# Patient Record
Sex: Male | Born: 1950 | Race: White | Hispanic: No | Marital: Married | Smoking: Never smoker
Health system: Southern US, Community
[De-identification: ages and names within clinical notes are randomized; demographics above are authoritative.]

## PROBLEM LIST (undated history)

## (undated) DIAGNOSIS — J302 Other seasonal allergic rhinitis: Secondary | ICD-10-CM

## (undated) DIAGNOSIS — M1990 Unspecified osteoarthritis, unspecified site: Secondary | ICD-10-CM

## (undated) DIAGNOSIS — H269 Unspecified cataract: Secondary | ICD-10-CM

## (undated) DIAGNOSIS — B029 Zoster without complications: Secondary | ICD-10-CM

## (undated) DIAGNOSIS — T1490XA Injury, unspecified, initial encounter: Secondary | ICD-10-CM

## (undated) HISTORY — DX: Unspecified osteoarthritis, unspecified site: M19.90

## (undated) HISTORY — DX: Other seasonal allergic rhinitis: J30.2

## (undated) HISTORY — PX: ROTATOR CUFF REPAIR: SHX139

## (undated) HISTORY — PX: VASECTOMY: SHX75

## (undated) HISTORY — DX: Unspecified cataract: H26.9

## (undated) HISTORY — DX: Injury, unspecified, initial encounter: T14.90XA

## (undated) HISTORY — DX: Zoster without complications: B02.9

## (undated) HISTORY — PX: EYE SURGERY: SHX253

---

## 2007-03-24 ENCOUNTER — Ambulatory Visit: Payer: Self-pay | Admitting: Family Medicine

## 2008-09-15 ENCOUNTER — Ambulatory Visit: Payer: Self-pay | Admitting: Internal Medicine

## 2010-05-16 ENCOUNTER — Ambulatory Visit: Payer: Self-pay | Admitting: Family Medicine

## 2010-08-08 ENCOUNTER — Ambulatory Visit: Payer: Self-pay | Admitting: Internal Medicine

## 2012-05-26 ENCOUNTER — Ambulatory Visit: Payer: Self-pay | Admitting: Unknown Physician Specialty

## 2012-05-26 LAB — HM COLONOSCOPY

## 2012-08-30 ENCOUNTER — Ambulatory Visit: Payer: Self-pay | Admitting: Sports Medicine

## 2013-05-22 ENCOUNTER — Ambulatory Visit: Payer: Self-pay | Admitting: Ophthalmology

## 2013-06-05 ENCOUNTER — Ambulatory Visit: Payer: Self-pay | Admitting: Ophthalmology

## 2014-05-06 DIAGNOSIS — N401 Enlarged prostate with lower urinary tract symptoms: Secondary | ICD-10-CM | POA: Insufficient documentation

## 2014-05-06 DIAGNOSIS — N529 Male erectile dysfunction, unspecified: Secondary | ICD-10-CM | POA: Insufficient documentation

## 2014-05-06 DIAGNOSIS — N138 Other obstructive and reflux uropathy: Secondary | ICD-10-CM | POA: Insufficient documentation

## 2014-05-06 DIAGNOSIS — N500 Atrophy of testis: Secondary | ICD-10-CM | POA: Insufficient documentation

## 2014-05-06 DIAGNOSIS — N4340 Spermatocele of epididymis, unspecified: Secondary | ICD-10-CM | POA: Insufficient documentation

## 2014-09-27 NOTE — Op Note (Signed)
PATIENT NAME:  Scott Le, Scott Le MR#:  [id number] DATE OF BIRTH:  Sep 15, 1950  DATE OF PROCEDURE:  05/22/2013  PREOPERATIVE DIAGNOSIS: Visually significant cataract of the right eye.   POSTOPERATIVE DIAGNOSIS: Visually significant cataract of the right eye.   OPERATIVE PROCEDURE: Cataract extraction by phacoemulsification with implant of Toric intraocular lens to right eye.   SURGEON: Birder Robson, MD.   ANESTHESIA:  1. Managed anesthesia care.  2. Topical tetracaine drops followed by 2% Xylocaine jelly applied in the preoperative holding area.   COMPLICATIONS: None.   TECHNIQUE:  Stop-and-chop.  DESCRIPTION OF PROCEDURE: The patient was examined and consented in the preoperative holding area where the aforementioned topical anesthesia was applied to the right eye and then brought back to the Operating Room where the right eye was prepped and draped in the usual sterile ophthalmic fashion and a lid speculum was placed. A paracentesis was created with the side port blade and the anterior chamber was filled with viscoelastic. A near clear corneal incision was performed with the steel keratome. A continuous curvilinear capsulorrhexis was performed with a cystotome followed by the capsulorrhexis forceps. Hydrodissection and hydrodelineation were carried out with BSS on a blunt cannula. The lens was removed in a stop-and-chop technique and the remaining cortical material was removed with the irrigation-aspiration handpiece. The capsular bag was inflated with viscoelastic and the Tecnis Toric ZCT400, 23.5-diopter lens, serial number [id number] was placed in the capsular bag without complication. The lens was rotated to a final resting position of 180 degrees.  The remaining viscoelastic was removed from the eye with the irrigation-aspiration handpiece. The wounds were hydrated. The anterior chamber was flushed with Miostat and the eye was inflated to physiologic pressure. 0.1 mL of cefuroxime  concentration 10 mg/mL was placed in the anterior chamber. The wounds were found to be water tight. The eye was dressed with Vigamox. The patient was given protective glasses to wear throughout the day and a shield with which to sleep tonight. The patient was also given drops with which to begin a drop regimen today and will follow-up with me in one day.  ____________________________ Livingston Diones. Uthman Mroczkowski, MD wlp:cs D: 05/22/2013 15:24:27 ET T: 05/22/2013 15:50:50 ET JOB#: [id number]  cc: Chenille Toor L. Szymon Foiles, MD, <Dictator> Livingston Diones Waunita Sandstrom MD ELECTRONICALLY SIGNED 05/23/2013 9:50

## 2015-05-16 ENCOUNTER — Encounter: Payer: Self-pay | Admitting: Emergency Medicine

## 2015-05-16 ENCOUNTER — Ambulatory Visit
Admission: EM | Admit: 2015-05-16 | Discharge: 2015-05-16 | Disposition: A | Discharge status: Home / Self Care | Payer: Medicare Other | Attending: Family Medicine | Admitting: Family Medicine

## 2015-05-16 DIAGNOSIS — H6983 Other specified disorders of Eustachian tube, bilateral: Secondary | ICD-10-CM

## 2015-05-16 DIAGNOSIS — H6993 Unspecified Eustachian tube disorder, bilateral: Secondary | ICD-10-CM

## 2015-05-16 DIAGNOSIS — R05 Cough: Secondary | ICD-10-CM

## 2015-05-16 DIAGNOSIS — R059 Cough, unspecified: Secondary | ICD-10-CM

## 2015-05-16 DIAGNOSIS — J0100 Acute maxillary sinusitis, unspecified: Secondary | ICD-10-CM

## 2015-05-16 MED ORDER — HYDROCOD POLST-CPM POLST ER 10-8 MG/5ML PO SUER: 5.0000 mL | Freq: Two times a day (BID) | ORAL | Status: DC | PRN

## 2015-05-16 MED ORDER — FEXOFENADINE-PSEUDOEPHED ER 180-240 MG PO TB24: 1.0000 | ORAL_TABLET | Freq: Every day | ORAL | Status: DC

## 2015-05-16 MED ORDER — AMOXICILLIN-POT CLAVULANATE 875-125 MG PO TABS: 1.0000 | ORAL_TABLET | Freq: Two times a day (BID) | ORAL | Status: DC

## 2015-05-16 NOTE — ED Provider Notes (Signed)
CSN: [id number]     Arrival date & time 05/16/15  [id number] History   First MD Initiated Contact with Patient 05/16/15 1053    Nurses notes were reviewed. Chief Complaint  Patient presents with  . Cough  . Nasal Congestion   patient reports getting sick about Thanksgiving. He reports having a URI cold since then actually was feeling better until this past Tuesday he has got worse. Start having nasal congestion pressure behind his eyes and around his nostrils and pain going behind his ear as well. States he never really got over the cold. The cough is somewhat better but now it has started back up again (Consider location/radiation/quality/duration/timing/severity/associated sxs/prior Treatment) HPI  History reviewed. No pertinent past medical history. Past Surgical History  Procedure Laterality Date  . Rotator cuff repair Right    History reviewed. No pertinent family history. Social History  Substance Use Topics  . Smoking status: Never Smoker   . Smokeless tobacco: None  . Alcohol Use: No    Review of Systems  HENT: Positive for ear pain, facial swelling, sinus pressure and sneezing.   Respiratory: Positive for cough.   Skin: Negative for color change and rash.  All other systems reviewed and are negative.   Allergies  Levaquin  Home Medications   Prior to Admission medications   Medication Sig Start Date End Date Taking? Authorizing Provider  amoxicillin-clavulanate (AUGMENTIN) 875-125 MG tablet Take 1 tablet by mouth 2 (two) times daily. 05/16/15   Frederich Cha, MD  chlorpheniramine-HYDROcodone [facility] PENNKINETIC ER) 10-8 MG/5ML SUER Take 5 mLs by mouth every 12 (twelve) hours as needed for cough. 05/16/15   Frederich Cha, MD  fexofenadine-pseudoephedrine (ALLEGRA-D ALLERGY & CONGESTION) 180-240 MG 24 hr tablet Take 1 tablet by mouth daily. 05/16/15   Frederich Cha, MD   Meds Ordered and Administered this Visit  Medications - No data to display  BP 121/77 mmHg  Pulse 66   Temp(Src) 98.9 F (37.2 C) (Tympanic)  Resp 17  Ht 5\' 8"  (1.727 m)  Wt 169 lb (76.658 kg)  BMI 25.70 kg/m2  SpO2 100% No data found.   Physical Exam  Constitutional: He is oriented to person, place, and time. He appears well-developed and well-nourished.  HENT:  Head: Normocephalic and atraumatic.  Right Ear: Hearing, tympanic membrane, external ear and ear canal normal.  Left Ear: Hearing, tympanic membrane, external ear and ear canal normal.  Nose: Mucosal edema and rhinorrhea present.  Mouth/Throat: No dental abscesses or dental caries. No posterior oropharyngeal edema.  Eyes: Conjunctivae are normal. Pupils are equal, round, and reactive to light.  Neck: Normal range of motion. Neck supple.  Cardiovascular: Normal rate, regular rhythm and normal heart sounds.   Pulmonary/Chest: Effort normal and breath sounds normal.  Musculoskeletal: Normal range of motion.  Lymphadenopathy:    He has cervical adenopathy.  Neurological: He is alert and oriented to person, place, and time.  Skin: Skin is warm and dry. No erythema.  Psychiatric: He has a normal mood and affect.  Vitals reviewed.   ED Course  Procedures (including critical care time)  Labs Review Labs Reviewed - No data to display  Imaging Review No results found.   Visual Acuity Review  Right Eye Distance:   Left Eye Distance:   Bilateral Distance:    Right Eye Near:   Left Eye Near:    Bilateral Near:         MDM   1. Acute maxillary sinusitis, recurrence not specified  2. Eustachian tube dysfunction, bilateral   3. Cough       Placed on Tussionex 1 teaspoon twice a day for the cough from postnasal drainage is keeping him up Allegra-D 1 tablet daily sinus infection for 10 days. Note for work. See PCP if not better with this treatment.--     Frederich Cha, MD 05/16/15 1145

## 2015-05-16 NOTE — Discharge Instructions (Signed)
Cough, Adult A cough helps to clear your throat and lungs. A cough may last only 2-3 weeks (acute), or it may last longer than 8 weeks (chronic). Many different things can cause a cough. A cough may be a sign of an illness or another medical condition. HOME CARE  Pay attention to any changes in your cough.  Take medicines only as told by your doctor.  If you were prescribed an antibiotic medicine, take it as told by your doctor. Do not stop taking it even if you start to feel better.  Talk with your doctor before you try using a cough medicine.  Drink enough fluid to keep your pee (urine) clear or pale yellow.  If the air is dry, use a cold steam vaporizer or humidifier in your home.  Stay away from things that make you cough at work or at home.  If your cough is worse at night, try using extra pillows to raise your head up higher while you sleep.  Do not smoke, and try not to be around smoke. If you need help quitting, ask your doctor.  Do not have caffeine.  Do not drink alcohol.  Rest as needed. GET HELP IF:  You have new problems (symptoms).  You cough up yellow fluid (pus).  Your cough does not get better after 2-3 weeks, or your cough gets worse.  Medicine does not help your cough and you are not sleeping well.  You have pain that gets worse or pain that is not helped with medicine.  You have a fever.  You are losing weight and you do not know why.  You have night sweats. GET HELP RIGHT AWAY IF:  You cough up blood.  You have trouble breathing.  Your heartbeat is very fast.   This information is not intended to replace advice given to you by your health care provider. Make sure you discuss any questions you have with your health care provider.   Document Released: 02/04/2011 Document Revised: 02/12/2015 Document Reviewed: 07/31/2014 Elsevier Interactive Patient Education 2016 Elsevier Inc.  Upper Respiratory Infection, Adult Most upper respiratory  infections (URIs) are a viral infection of the air passages leading to the lungs. A URI affects the nose, throat, and upper air passages. The most common type of URI is nasopharyngitis and is typically referred to as "the common cold." URIs run their course and usually go away on their own. Most of the time, a URI does not require medical attention, but sometimes a bacterial infection in the upper airways can follow a viral infection. This is called a secondary infection. Sinus and middle ear infections are common types of secondary upper respiratory infections. Bacterial pneumonia can also complicate a URI. A URI can worsen asthma and chronic obstructive pulmonary disease (COPD). Sometimes, these complications can require emergency medical care and may be life threatening.  CAUSES Almost all URIs are caused by viruses. A virus is a type of germ and can spread from one person to another.  RISKS FACTORS You may be at risk for a URI if:   You smoke.   You have chronic heart or lung disease.  You have a weakened defense (immune) system.   You are very young or very old.   You have nasal allergies or asthma.  You work in crowded or poorly ventilated areas.  You work in health care facilities or schools. SIGNS AND SYMPTOMS  Symptoms typically develop 2-3 days after you come in contact with a cold virus.  Most viral URIs last 7-10 days. However, viral URIs from the influenza virus (flu virus) can last 14-18 days and are typically more severe. Symptoms may include:   Runny or stuffy (congested) nose.   Sneezing.   Cough.   Sore throat.   Headache.   Fatigue.   Fever.   Loss of appetite.   Pain in your forehead, behind your eyes, and over your cheekbones (sinus pain).  Muscle aches.  DIAGNOSIS  Your health care provider may diagnose a URI by:  Physical exam.  Tests to check that your symptoms are not due to another condition such as:  Strep  throat.  Sinusitis.  Pneumonia.  Asthma. TREATMENT  A URI goes away on its own with time. It cannot be cured with medicines, but medicines may be prescribed or recommended to relieve symptoms. Medicines may help:  Reduce your fever.  Reduce your cough.  Relieve nasal congestion. HOME CARE INSTRUCTIONS   Take medicines only as directed by your health care provider.   Gargle warm saltwater or take cough drops to comfort your throat as directed by your health care provider.  Use a warm mist humidifier or inhale steam from a shower to increase air moisture. This may make it easier to breathe.  Drink enough fluid to keep your urine clear or pale yellow.   Eat soups and other clear broths and maintain good nutrition.   Rest as needed.   Return to work when your temperature has returned to normal or as your health care provider advises. You may need to stay home longer to avoid infecting others. You can also use a face mask and careful hand washing to prevent spread of the virus.  Increase the usage of your inhaler if you have asthma.   Do not use any tobacco products, including cigarettes, chewing tobacco, or electronic cigarettes. If you need help quitting, ask your health care provider. PREVENTION  The best way to protect yourself from getting a cold is to practice good hygiene.   Avoid oral or hand contact with people with cold symptoms.   Wash your hands often if contact occurs.  There is no clear evidence that vitamin C, vitamin E, echinacea, or exercise reduces the chance of developing a cold. However, it is always recommended to get plenty of rest, exercise, and practice good nutrition.  SEEK MEDICAL CARE IF:   You are getting worse rather than better.   Your symptoms are not controlled by medicine.   You have chills.  You have worsening shortness of breath.  You have brown or red mucus.  You have yellow or brown nasal discharge.  You have pain in your  face, especially when you bend forward.  You have a fever.  You have swollen neck glands.  You have pain while swallowing.  You have white areas in the back of your throat. SEEK IMMEDIATE MEDICAL CARE IF:   You have severe or persistent:  Headache.  Ear pain.  Sinus pain.  Chest pain.  You have chronic lung disease and any of the following:  Wheezing.  Prolonged cough.  Coughing up blood.  A change in your usual mucus.  You have a stiff neck.  You have changes in your:  Vision.  Hearing.  Thinking.  Mood. MAKE SURE YOU:   Understand these instructions.  Will watch your condition.  Will get help right away if you are not doing well or get worse.   This information is not intended to replace  advice given to you by your health care provider. Make sure you discuss any questions you have with your health care provider.   Document Released: 11/17/2000 Document Revised: 10/08/2014 Document Reviewed: 08/29/2013 Elsevier Interactive Patient Education 2016 Elsevier Inc.  Sinusitis, Adult Sinusitis is redness, soreness, and puffiness (inflammation) of the air pockets in the bones of your face (sinuses). The redness, soreness, and puffiness can cause air and mucus to get trapped in your sinuses. This can allow germs to grow and cause an infection.  HOME CARE   Drink enough fluids to keep your pee (urine) clear or pale yellow.  Use a humidifier in your home.  Run a hot shower to create steam in the bathroom. Sit in the bathroom with the door closed. Breathe in the steam 3-4 times a day.  Put a warm, moist washcloth on your face 3-4 times a day, or as told by your doctor.  Use salt water sprays (saline sprays) to wet the thick fluid in your nose. This can help the sinuses drain.  Only take medicine as told by your doctor. GET HELP RIGHT AWAY IF:   Your pain gets worse.  You have very bad headaches.  You are sick to your stomach (nauseous).  You throw  up (vomit).  You are very sleepy (drowsy) all the time.  Your face is puffy (swollen).  Your vision changes.  You have a stiff neck.  You have trouble breathing. MAKE SURE YOU:   Understand these instructions.  Will watch your condition.  Will get help right away if you are not doing well or get worse.   This information is not intended to replace advice given to you by your health care provider. Make sure you discuss any questions you have with your health care provider.   Document Released: 11/10/2007 Document Revised: 06/14/2014 Document Reviewed: 12/28/2011 Elsevier Interactive Patient Education [employer].

## 2015-05-16 NOTE — ED Notes (Signed)
Patient c/o sinus pain and pressure, right ear pain, HAs and cough that started on Thanksgiving but has gotten worse since Wed.  Patient reports low grade fever.

## 2015-07-23 DIAGNOSIS — Z8249 Family history of ischemic heart disease and other diseases of the circulatory system: Secondary | ICD-10-CM | POA: Insufficient documentation

## 2015-07-28 NOTE — Progress Notes (Signed)
Cardiology Office Note Date:  07/29/2015  Patient ID:  Breyson, Fiebelkorn [birth year], MRN [id number] PCP:  Lelon Huh, MD  Cardiologist:  Dr. Fletcher Anon, MD    Chief Complaint: Increasing fatigue and strong family history of premature CAD  History of Present Illness: KALEEL PLETT is a 65 y.o. male with history of arthritis and testicular atrophy/hypogonadism presents for evaluation of increasing fatigue at the request of Dr. Jacqlyn Larsen, MD. He has known family history of premature CAD with his father suffering his first MI at age 62 and subsequently passing from an MI at age 24 years. His mother suffered her first MI at age 7 years, was s/p multiple PCI/stents, and underwent TAVR. The patient has no previously known cardiac history. He previously worked in [employer], followed by [employer], and now works for [employer]. He exercises 2-3 times per week with 20-40 minutes of cardio. He gets in 12,000 to 14,000 steps per day with his pedometer. He is completely asymptomatic from a chest pain or SOB standpoint with his exercise regimen. He eats a low fat, high protein diet and limits his caffeine intake. His weight has been stable and he denies any orthopnea, PND, lower extremity edema, or early satiety. He was never a smoker, and denies any alcohol or illegal drug abuse. He has seen Dr. Jacqlyn Larsen for mild erectile dysfunction, which has not required the usage of testosterone. His last available lab work from 08/2014 indicates a normal thyrord function, total and free testosterone, LH, and prolactin levels. There are no cardiac studies for review. He comes in this morning feeling well. He reports his wife has been telling him for years he needed to see a cardiologist given his family history.    Past Medical History  Diagnosis Date  . Seasonal allergies   . Arthritis     Past Surgical History  Procedure Laterality Date  . Rotator cuff repair Right     Current Outpatient Prescriptions    Medication Sig Dispense Refill  . fexofenadine-pseudoephedrine (ALLEGRA-D ALLERGY & CONGESTION) 180-240 MG 24 hr tablet Take 1 tablet by mouth daily. (Patient taking differently: Take 1 tablet by mouth as needed (as needed for allergies). ) 30 tablet 0   No current facility-administered medications for this visit.    Allergies:   Levaquin and Nsaids   Social History:  The patient  reports that he has never smoked. He does not have any smokeless tobacco history on file. He reports that he does not drink alcohol or use illicit drugs.   Family History:  The patient's family history includes CAD (age of onset: 44) in his father; CAD (age of onset: 46) in his mother; Valvular heart disease in his mother.  ROS:   Review of Systems  Constitutional: Positive for malaise/fatigue. Negative for fever, chills, weight loss and diaphoresis.  HENT: Negative for congestion.   Eyes: Negative for discharge and redness.  Respiratory: Negative for cough, hemoptysis, sputum production, shortness of breath and wheezing.   Cardiovascular: Negative for chest pain, palpitations, orthopnea, claudication, leg swelling and PND.  Gastrointestinal: Negative for nausea, vomiting and abdominal pain.  Musculoskeletal: Negative for myalgias, back pain, joint pain, falls and neck pain.  Skin: Negative for rash.  Neurological: Negative for dizziness, tingling, tremors, sensory change, speech change, focal weakness, seizures, loss of consciousness and weakness.  Endo/Heme/Allergies: Does not bruise/bleed easily.  Psychiatric/Behavioral: Negative for substance abuse. The patient is not nervous/anxious.   All other systems reviewed and are negative.  PHYSICAL EXAM:  VS:  BP 114/80 mmHg  Pulse 63  Ht 5\' 8"  (1.727 m)  Wt 180 lb 6.4 oz (81.829 kg)  BMI 27.44 kg/m2 BMI: Body mass index is 27.44 kg/(m^2). Well nourished, well developed, in no acute distress HEENT: normocephalic, atraumatic Neck: no JVD, carotid bruits  or masses Cardiac:  normal S1, S2; RRR; no murmurs, rubs, or gallops Lungs:  clear to auscultation bilaterally, no wheezing, rhonchi or rales Abd: soft, nontender, no hepatomegaly, + BS MS: no deformity or atrophy Ext: no edema Skin: warm and dry, no rash Neuro:  moves all extremities spontaneously, no focal abnormalities noted, follows commands Psych: euthymic mood, full affect Vascular: bilateral lower extremity distal pulses 2+   EKG:  Was ordered today. Shows NSR, 63 bpm, TWI III  Recent Labs: 07/29/2015: Hemoglobin 14.3; Platelets 167  No results found for requested labs within last 365 days.   CrCl cannot be calculated (Patient has no serum creatinine result on file.).   Wt Readings from Last 3 Encounters:  07/29/15 180 lb 6.4 oz (81.829 kg)  05/16/15 169 lb (76.658 kg)     Other studies reviewed: Additional studies/records reviewed today include: summarized above  ASSESSMENT AND PLAN:  1. Strong family history of premature coronary artery disease: Patient has been asymptomatic to this point and works out 20-40 minutes 2-3 times weekly. 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 He has noticed a decline in his stamina over the past year or so, though continues to be quite active. He does have a significant family history of premature CAD as detailed above. I will schedule him for a treadmill Myoview to evaluate for high risk ischemia.   2. Increasing fatigue: Schedule for treadmill Myoview as above to evaluate his functional capacity. Chest cmet, cbc, A1C, and TSH.   3. Risk stratification: Check lipid, A1C, cmet, cbc, and TSH.   Disposition: F/u with Dr. Fletcher Anon, MD s/p treadmill Myoview  Current medicines are reviewed at length with the patient today.  The patient did not have any concerns regarding medicines.  Melvern Banker PA-C 07/29/2015 11:43 AM     [facility] [address]  [place], [place] [address] [phone]

## 2015-07-29 ENCOUNTER — Other Ambulatory Visit
Admission: RE | Admit: 2015-07-29 | Discharge: 2015-07-29 | Disposition: A | Discharge status: Home / Self Care | Payer: Medicare HMO | Source: Ambulatory Visit | Attending: Physician Assistant | Admitting: Physician Assistant

## 2015-07-29 ENCOUNTER — Ambulatory Visit (INDEPENDENT_AMBULATORY_CARE_PROVIDER_SITE_OTHER): Payer: Medicare HMO | Admitting: Physician Assistant

## 2015-07-29 ENCOUNTER — Encounter: Payer: Self-pay | Admitting: Physician Assistant

## 2015-07-29 VITALS — BP 114/80 | HR 63 | Ht 68.0 in | Wt 180.4 lb

## 2015-07-29 DIAGNOSIS — R0602 Shortness of breath: Secondary | ICD-10-CM

## 2015-07-29 DIAGNOSIS — Z8249 Family history of ischemic heart disease and other diseases of the circulatory system: Secondary | ICD-10-CM | POA: Diagnosis not present

## 2015-07-29 DIAGNOSIS — E291 Testicular hypofunction: Secondary | ICD-10-CM | POA: Diagnosis not present

## 2015-07-29 LAB — LIPID PANEL
CHOL/HDL RATIO: 3.3 ratio
Cholesterol: 183 mg/dL (ref 0–200)
HDL: 55 mg/dL (ref >40)
LDL CALC: 113 mg/dL — AB (ref 0–99)
TRIGLYCERIDES: 75 mg/dL (ref <150)
VLDL: 15 mg/dL (ref 0–40)

## 2015-07-29 LAB — COMPREHENSIVE METABOLIC PANEL
ALT: 25 U/L (ref 17–63)
AST: 26 U/L (ref 15–41)
Albumin: 4.4 g/dL (ref 3.5–5.0)
Alkaline Phosphatase: 73 U/L (ref 38–126)
Anion gap: 4 — ABNORMAL LOW (ref 5–15)
BILIRUBIN TOTAL: 0.6 mg/dL (ref 0.3–1.2)
BUN: 31 mg/dL — AB (ref 6–20)
CALCIUM: 9.5 mg/dL (ref 8.9–10.3)
CO2: 32 mmol/L (ref 22–32)
Chloride: 103 mmol/L (ref 101–111)
Creatinine, Ser: 1.02 mg/dL (ref 0.61–1.24)
GFR calc Af Amer: >60 mL/min (ref >60)
Glucose, Bld: 99 mg/dL (ref 65–99)
POTASSIUM: 4.5 mmol/L (ref 3.5–5.1)
Sodium: 139 mmol/L (ref 135–145)
TOTAL PROTEIN: 7.5 g/dL (ref 6.5–8.1)

## 2015-07-29 LAB — CBC
HEMATOCRIT: 43.6 % (ref 40.0–52.0)
HEMOGLOBIN: 14.3 g/dL (ref 13.0–18.0)
MCH: 27.1 pg (ref 26.0–34.0)
MCHC: 32.8 g/dL (ref 32.0–36.0)
MCV: 82.7 fL (ref 80.0–100.0)
Platelets: 167 10*3/uL (ref 150–440)
RBC: 5.27 MIL/uL (ref 4.40–5.90)
RDW: 15.1 % — ABNORMAL HIGH (ref 11.5–14.5)
WBC: 5.5 10*3/uL (ref 3.8–10.6)

## 2015-07-29 LAB — TSH: TSH: 2.076 u[IU]/mL (ref 0.350–4.500)

## 2015-07-29 LAB — HEMOGLOBIN A1C: Hgb A1c MFr Bld: 4.9 % (ref 4.0–6.0)

## 2015-07-29 NOTE — Patient Instructions (Addendum)
Medication Instructions:  Your physician recommends that you continue on your current medications as directed. Please refer to the Current Medication list given to you today.   Labwork: Lipid, A1C, CMET, CBC, TSH at [facility] lab today  Testing/Procedures: Your physician has requested that you have a lexiscan myoview. For further information please visit [email or web address]. Please follow instruction sheet, as given.  [place]  Your caregiver has ordered a Stress Test with nuclear imaging. The purpose of this test is to evaluate the blood supply to your heart muscle. This procedure is referred to as a "Non-Invasive Stress Test." This is because other than having an IV started in your vein, nothing is inserted or "invades" your body. Cardiac stress tests are done to find areas of poor blood flow to the heart by determining the extent of coronary artery disease (CAD). Some patients exercise on a treadmill, which naturally increases the blood flow to your heart, while others who are  unable to walk on a treadmill due to physical limitations have a pharmacologic/chemical stress agent called Lexiscan . This medicine will mimic walking on a treadmill by temporarily increasing your coronary blood flow.   Please note: these test may take anywhere between 2-4 hours to complete  PLEASE REPORT TO Scott Le TO GO  Date of Procedure: Wednesday, March 1  Arrival Time for Procedure: 7:15am  Instructions regarding medication:  You may take your morning medications with a sip of water.  PLEASE NOTIFY THE OFFICE AT LEAST 57 HOURS IN ADVANCE IF YOU ARE UNABLE TO KEEP YOUR APPOINTMENT.  [phone] AND  PLEASE NOTIFY NUCLEAR MEDICINE AT [facility] AT LEAST 24 HOURS IN ADVANCE IF YOU ARE UNABLE TO KEEP YOUR APPOINTMENT. [phone]  How to prepare for your Myoview test:   Do not eat or drink after midnight  No caffeine for 24 hours  prior to test  No smoking 24 hours prior to test.  Your medication may be taken with water.  If your doctor stopped a medication because of this test, do not take that medication.  Ladies, please do not wear dresses.  Skirts or pants are appropriate. Please wear a short sleeve shirt.  No perfume, cologne or lotion.  Wear comfortable walking shoes. No heels!            Follow-Up: Your physician recommends that you schedule a follow-up appointment with Dr. Fletcher Anon after your treadmill myoview.    Any Other Special Instructions Will Be Listed Below (If Applicable).     If you need a refill on your cardiac medications before your next appointment, please call your pharmacy.  Cardiac Nuclear Scanning A cardiac nuclear scan is used to check your heart for problems, such as the following:  A portion of the heart is not getting enough blood.  Part of the heart muscle has died, which happens with a heart attack.  The heart wall is not working normally.  In this test, a radioactive dye (tracer) is injected into your bloodstream. After the tracer has traveled to your heart, a scanning device is used to measure how much of the tracer is absorbed by or distributed to various areas of your heart. LET [facility] CARE PROVIDER KNOW ABOUT:  Any allergies you have.  All medicines you are taking, including vitamins, herbs, eye drops, creams, and over-the-counter medicines.  Previous problems you or members of your family have had with the use of anesthetics.  Any  blood disorders you have.  Previous surgeries you have had.  Medical conditions you have.  RISKS AND COMPLICATIONS Generally, this is a safe procedure. However, as with any procedure, problems can occur. Possible problems include:   Serious chest pain.  Rapid heartbeat.  Sensation of warmth in your chest. This usually passes quickly. BEFORE THE PROCEDURE Ask your health care provider about changing or stopping your  regular medicines. PROCEDURE This procedure is usually done at a hospital and takes 2-4 hours.  An IV tube is inserted into one of your veins.  Your health care provider will inject a small amount of radioactive tracer through the tube.  You will then wait for 20-40 minutes while the tracer travels through your bloodstream.  You will lie down on an exam table so images of your heart can be taken. Images will be taken for about 15-20 minutes.  You will exercise on a treadmill or stationary bike. While you exercise, your heart activity will be monitored with an electrocardiogram (ECG), and your blood pressure will be checked.  If you are unable to exercise, you may be given a medicine to make your heart beat faster.  When blood flow to your heart has peaked, tracer will again be injected through the IV tube.  After 20-40 minutes, you will get back on the exam table and have more images taken of your heart.  When the procedure is over, your IV tube will be removed. AFTER THE PROCEDURE  You will likely be able to leave shortly after the test. Unless your health care provider tells you otherwise, you may return to your normal schedule, including diet, activities, and medicines.  Make sure you find out how and when you will get your test results.   This information is not intended to replace advice given to you by your health care provider. Make sure you discuss any questions you have with your health care provider.   Document Released: 06/18/2004 Document Revised: 05/29/2013 Document Reviewed: 05/02/2013 Elsevier Interactive Patient Education [employer].

## 2015-08-04 ENCOUNTER — Other Ambulatory Visit: Payer: Self-pay

## 2015-08-04 ENCOUNTER — Telehealth: Payer: Self-pay

## 2015-08-04 DIAGNOSIS — E785 Hyperlipidemia, unspecified: Secondary | ICD-10-CM

## 2015-08-04 NOTE — Telephone Encounter (Signed)
Left message on machine for patient to contact the office.  See lipid and liver profile

## 2015-08-04 NOTE — Telephone Encounter (Signed)
Pt would like lab results. Please call. 

## 2015-08-04 NOTE — Telephone Encounter (Signed)
Left message on machine.

## 2015-08-05 ENCOUNTER — Telehealth: Payer: Self-pay | Admitting: Physician Assistant

## 2015-08-05 ENCOUNTER — Other Ambulatory Visit: Payer: Self-pay

## 2015-08-05 DIAGNOSIS — R942 Abnormal results of pulmonary function studies: Secondary | ICD-10-CM

## 2015-08-05 NOTE — Telephone Encounter (Signed)
Left detailed message on pt home VM that treadmill myoview cancelled d/t insurance denial. Requested call back

## 2015-08-05 NOTE — Telephone Encounter (Signed)
Left message on pt home VM. Insurance denied treadmill myoview. Per Dr. Fletcher Anon, may change to GXT. Order cancelled in epic, notified Abby in [facility].

## 2015-08-06 NOTE — Telephone Encounter (Signed)
Pt returned call regarding treadmill myoview cancelled d/t insurance denial.  Pt will check work schedule and CB to confirm GXT March 7. States he is going to call insurance co regarding denial. Reviewed lab results (see lab note). Pt prefers to adjust diet instead of lipitor 40mg . States "I know what that is from. I ate anything and everything during Thanksgiving and Christmas and haven't been going to the gym". He will eat healthier w/repeat labs in 4 months. Pt work number is incorrect. Changed per pt to [phone]

## 2015-08-12 ENCOUNTER — Ambulatory Visit (INDEPENDENT_AMBULATORY_CARE_PROVIDER_SITE_OTHER): Payer: Medicare HMO

## 2015-08-12 DIAGNOSIS — R942 Abnormal results of pulmonary function studies: Secondary | ICD-10-CM | POA: Diagnosis not present

## 2015-08-15 LAB — EXERCISE TOLERANCE TEST
CSEPED: 9 min
CSEPEW: 10.1 METS
CSEPHR: 87 %
CSEPPHR: 136 {beats}/min
Exercise duration (sec): 4 s
MPHR: 155 {beats}/min
Rest HR: 63 {beats}/min

## 2015-08-18 ENCOUNTER — Ambulatory Visit (INDEPENDENT_AMBULATORY_CARE_PROVIDER_SITE_OTHER): Payer: Medicare HMO | Admitting: Cardiovascular Disease

## 2015-08-18 ENCOUNTER — Encounter: Payer: Self-pay | Admitting: Cardiovascular Disease

## 2015-08-18 VITALS — BP 124/70 | HR 64 | Ht 68.0 in | Wt 180.8 lb

## 2015-08-18 DIAGNOSIS — E785 Hyperlipidemia, unspecified: Secondary | ICD-10-CM | POA: Diagnosis not present

## 2015-08-18 NOTE — Progress Notes (Signed)
Cardiology Office Note   Date:  08/18/2015   ID:  Scott Le, DOB 06-22-1950, MRN [id number]  PCP:  Lelon Huh, MD  Cardiologist:   Kathlyn Sacramento, MD   Chief Complaint  Patient presents with  . other    Follow up from a stress test. Meds reviewed by the patient verbally. "doing well."       History of Present Illness: Scott Le is a 65 y.o. male who presents for a follow-up visit regarding strong family history of premature coronary artery disease. He has history of arthritis and testicular atrophy/hypogonadism  He has known family history of premature CAD with his father suffering his first MI at age 31 and subsequently passing from an MI at age 30 years. His mother suffered her first MI at age 51 years, was s/p multiple PCI/stents, and underwent TAVR.  The patient is physically very active with no significant exertional symptoms. He underwent a treadmill stress test which showed no evidence of ischemia with excellent exercise capacity and normal blood pressure response to exercise.   Past Medical History  Diagnosis Date  . Seasonal allergies   . Arthritis     Past Surgical History  Procedure Laterality Date  . Rotator cuff repair Right      No current outpatient prescriptions on file.   No current facility-administered medications for this visit.    Allergies:   Levaquin and Nsaids    Social History:  The patient  reports that he has never smoked. He does not have any smokeless tobacco history on file. He reports that he does not drink alcohol or use illicit drugs.   Family History:  The patient's family history includes CAD (age of onset: 52) in his father; CAD (age of onset: 48) in his mother; Valvular heart disease in his mother.    ROS:  Please see the history of present illness.   Otherwise, review of systems are positive for none.   All other systems are reviewed and negative.    PHYSICAL EXAM: VS:  BP 124/70 mmHg  Pulse 64  Ht 5'  8" (1.727 m)  Wt 180 lb 12 oz (81.988 kg)  BMI 27.49 kg/m2 , BMI Body mass index is 27.49 kg/(m^2). GEN: Well nourished, well developed, in no acute distress HEENT: normal Neck: no JVD, carotid bruits, or masses Cardiac: RRR; no murmurs, rubs, or gallops,no edema  Respiratory:  clear to auscultation bilaterally, normal work of breathing GI: soft, nontender, nondistended, + BS MS: no deformity or atrophy Skin: warm and dry, no rash Neuro:  Strength and sensation are intact Psych: euthymic mood, full affect   EKG:  EKG is not ordered today.   Recent Labs: 07/29/2015: ALT 25; BUN 31*; Creatinine, Ser 1.02; Hemoglobin 14.3; Platelets 167; Potassium 4.5; Sodium 139; TSH 2.076    Lipid Panel    Component Value Date/Time   CHOL 183 07/29/2015 1104   TRIG 75 07/29/2015 1104   HDL 55 07/29/2015 1104   CHOLHDL 3.3 07/29/2015 1104   VLDL 15 07/29/2015 1104   LDLCALC 113* 07/29/2015 1104      Wt Readings from Last 3 Encounters:  08/18/15 180 lb 12 oz (81.988 kg)  07/29/15 180 lb 6.4 oz (81.829 kg)  05/16/15 169 lb (76.658 kg)        ASSESSMENT AND PLAN:  1.  Screening for coronary artery disease: The patient has strong family history of premature coronary artery disease. He had a treadmill stress test which was  completely normal. I discussed with the patient the importance of lifestyle changes in order to decrease the chance of future coronary artery disease and cardiovascular events. We discussed the importance of controlling risk factors, healthy diet as well as regular exercise. I also explained to him that a normal stress test does not rule out atherosclerosis.   2. Hyperlipidemia: This was overall mild with an LDL of 113. He is determined to improve his diet. I discussed with him the option of proceeding with a coronary calcium score to identify if he has evidence of atherosclerosis which should guide [place] in terms of how aggressive we want to be with medical therapy. He is  going to think about this.     Disposition:   FU with me as needed.   Signed,  Kathlyn Sacramento, MD  08/18/2015 2:42 PM    [facility]

## 2015-08-18 NOTE — Patient Instructions (Addendum)
Medication Instructions: No change  Labwork: None.   Procedures/Testing: None.   Follow-Up: As needed with Dr. Fletcher Anon.   Any Additional Special Instructions Will Be Listed Below (If Applicable).  Consider a test called coronary calcium score to see if you have plaque build up or not.    If you need a refill on your cardiac medications before your next appointment, please call your pharmacy.

## 2015-08-24 ENCOUNTER — Encounter: Payer: Self-pay | Admitting: Emergency Medicine

## 2015-08-24 ENCOUNTER — Ambulatory Visit
Admission: EM | Admit: 2015-08-24 | Discharge: 2015-08-24 | Disposition: A | Discharge status: Home / Self Care | Payer: Medicare HMO | Attending: Family Medicine | Admitting: Family Medicine

## 2015-08-24 DIAGNOSIS — B349 Viral infection, unspecified: Secondary | ICD-10-CM

## 2015-08-24 LAB — RAPID INFLUENZA A&B ANTIGENS (ARMC ONLY)
INFLUENZA A (ARMC): NEGATIVE
INFLUENZA B (ARMC): NEGATIVE

## 2015-08-24 MED ORDER — OSELTAMIVIR PHOSPHATE 75 MG PO CAPS: 75.0000 mg | ORAL_CAPSULE | Freq: Two times a day (BID) | ORAL | Status: DC

## 2015-08-24 NOTE — ED Notes (Signed)
Patient c/o cough, chest congestion, runny nose, bodyaches and fever that started last night.

## 2015-08-24 NOTE — ED Provider Notes (Signed)
CSN: [id number]     Arrival date & time 08/24/15  1006 History   None    Chief Complaint  Patient presents with  . Cough  . Nasal Congestion  . Generalized Body Aches  . Fever   (Consider location/radiation/quality/duration/timing/severity/associated sxs/prior Treatment) Patient is a 65 y.o. male presenting with cough, fever, and URI. The history is provided by the patient.  Cough Associated symptoms: fever, headaches and myalgias   Fever Associated symptoms: cough, headaches and myalgias   URI Presenting symptoms: cough and fever   Severity:  Moderate Onset quality:  Sudden Duration:  1 day Timing:  Constant Progression:  Worsening Chronicity:  New Relieved by:  Nothing Ineffective treatments:  OTC medications Associated symptoms: headaches and myalgias   Associated symptoms: no sinus pain   Risk factors: sick contacts (confirmed flu cases at home)   Risk factors: not elderly, no chronic cardiac disease, no chronic kidney disease, no chronic respiratory disease, no diabetes mellitus, no immunosuppression, no recent illness and no recent travel     Past Medical History  Diagnosis Date  . Seasonal allergies   . Arthritis    Past Surgical History  Procedure Laterality Date  . Rotator cuff repair Right    Family History  Problem Relation Age of Onset  . CAD Father 80    s/p post first MI @ age 75, passed from [place] @ age 49  . CAD Mother 52    first MI @ age 64, s/p multiple stents  . Valvular heart disease Mother     s/p TAVR   Social History  Substance Use Topics  . Smoking status: Never Smoker   . Smokeless tobacco: None  . Alcohol Use: No    Review of Systems  Constitutional: Positive for fever.  Respiratory: Positive for cough.   Musculoskeletal: Positive for myalgias.  Neurological: Positive for headaches.    Allergies  Ciprofloxacin; Levaquin; and Nsaids  Home Medications   Prior to Admission medications   Medication Sig Start Date End Date  Taking? Authorizing Provider  oseltamivir (TAMIFLU) 75 MG capsule Take 1 capsule (75 mg total) by mouth 2 (two) times daily. 08/24/15   Norval Gable, MD   Meds Ordered and Administered this Visit  Medications - No data to display  BP 114/68 mmHg  Pulse 94  Temp(Src) 100.2 F (37.9 C) (Tympanic)  Resp 16  Ht 5\' 8"  (1.727 m)  Wt 178 lb (80.74 kg)  BMI 27.07 kg/m2  SpO2 100% No data found.   Physical Exam  Constitutional: He appears well-developed and well-nourished. No distress.  HENT:  Head: Normocephalic and atraumatic.  Right Ear: Tympanic membrane, external ear and ear canal normal.  Left Ear: Tympanic membrane, external ear and ear canal normal.  Nose: Nose normal.  Mouth/Throat: Uvula is midline, oropharynx is clear and moist and mucous membranes are normal. No oropharyngeal exudate or tonsillar abscesses.  Eyes: Conjunctivae and EOM are normal. Pupils are equal, round, and reactive to light. Right eye exhibits no discharge. Left eye exhibits no discharge. No scleral icterus.  Neck: Normal range of motion. Neck supple. No tracheal deviation present. No thyromegaly present.  Cardiovascular: Normal rate, regular rhythm and normal heart sounds.   Pulmonary/Chest: Effort normal and breath sounds normal. No stridor. No respiratory distress. He has no wheezes. He has no rales. He exhibits no tenderness.  Lymphadenopathy:    He has no cervical adenopathy.  Neurological: He is alert.  Skin: Skin is warm and dry. No rash noted.  He is not diaphoretic.  Nursing note and vitals reviewed.   ED Course  Procedures (including critical care time)  Labs Review Labs Reviewed  RAPID INFLUENZA A&B ANTIGENS ([place])    Imaging Review No results found.   Visual Acuity Review  Right Eye Distance:   Left Eye Distance:   Bilateral Distance:    Right Eye Near:   Left Eye Near:    Bilateral Near:         MDM   1. Viral syndrome    Discharge Medication List as of  08/24/2015 12:33 PM    START taking these medications   Details  oseltamivir (TAMIFLU) 75 MG capsule Take 1 capsule (75 mg total) by mouth 2 (two) times daily., Starting 08/24/2015, Until Discontinued, Normal       1. Lab results and diagnosis reviewed with patient 2. rx as per orders above; reviewed possible side effects, interactions, risks and benefits  3. Recommend supportive treatment with otc analgesics prn, fluids  4. Follow-up prn if symptoms worsen or don't improve    Norval Gable, MD 08/24/15 1307

## 2015-12-03 ENCOUNTER — Other Ambulatory Visit (INDEPENDENT_AMBULATORY_CARE_PROVIDER_SITE_OTHER): Payer: Medicare HMO | Admitting: *Deleted

## 2015-12-03 DIAGNOSIS — E785 Hyperlipidemia, unspecified: Secondary | ICD-10-CM | POA: Diagnosis not present

## 2015-12-04 LAB — HEPATIC FUNCTION PANEL
ALBUMIN: 4.8 g/dL (ref 3.6–4.8)
ALK PHOS: 85 IU/L (ref 39–117)
ALT: 21 IU/L (ref 0–44)
AST: 21 IU/L (ref 0–40)
BILIRUBIN TOTAL: 0.3 mg/dL (ref 0.0–1.2)
BILIRUBIN, DIRECT: 0.11 mg/dL (ref 0.00–0.40)
Total Protein: 6.9 g/dL (ref 6.0–8.5)

## 2015-12-04 LAB — LIPID PANEL
CHOLESTEROL TOTAL: 134 mg/dL (ref 100–199)
Chol/HDL Ratio: 3 ratio units (ref 0.0–5.0)
HDL: 44 mg/dL (ref >39)
LDL Calculated: 78 mg/dL (ref 0–99)
TRIGLYCERIDES: 62 mg/dL (ref 0–149)
VLDL Cholesterol Cal: 12 mg/dL (ref 5–40)

## 2015-12-10 ENCOUNTER — Encounter: Payer: Self-pay | Admitting: *Deleted

## 2016-02-05 DIAGNOSIS — Z1283 Encounter for screening for malignant neoplasm of skin: Secondary | ICD-10-CM | POA: Diagnosis not present

## 2016-02-05 DIAGNOSIS — D1800 Hemangioma unspecified site: Secondary | ICD-10-CM | POA: Diagnosis not present

## 2016-02-05 DIAGNOSIS — I8393 Asymptomatic varicose veins of bilateral lower extremities: Secondary | ICD-10-CM | POA: Diagnosis not present

## 2016-02-05 DIAGNOSIS — L812 Freckles: Secondary | ICD-10-CM | POA: Diagnosis not present

## 2016-02-05 DIAGNOSIS — L739 Follicular disorder, unspecified: Secondary | ICD-10-CM | POA: Diagnosis not present

## 2016-02-05 DIAGNOSIS — D171 Benign lipomatous neoplasm of skin and subcutaneous tissue of trunk: Secondary | ICD-10-CM | POA: Diagnosis not present

## 2016-02-05 DIAGNOSIS — L821 Other seborrheic keratosis: Secondary | ICD-10-CM | POA: Diagnosis not present

## 2016-02-05 DIAGNOSIS — I781 Nevus, non-neoplastic: Secondary | ICD-10-CM | POA: Diagnosis not present

## 2016-02-05 DIAGNOSIS — L578 Other skin changes due to chronic exposure to nonionizing radiation: Secondary | ICD-10-CM | POA: Diagnosis not present

## 2016-02-05 DIAGNOSIS — D229 Melanocytic nevi, unspecified: Secondary | ICD-10-CM | POA: Diagnosis not present

## 2016-02-26 DIAGNOSIS — I861 Scrotal varices: Secondary | ICD-10-CM | POA: Diagnosis not present

## 2016-02-26 DIAGNOSIS — N500 Atrophy of testis: Secondary | ICD-10-CM | POA: Diagnosis not present

## 2016-02-26 DIAGNOSIS — N4340 Spermatocele of epididymis, unspecified: Secondary | ICD-10-CM | POA: Diagnosis not present

## 2016-02-26 DIAGNOSIS — N529 Male erectile dysfunction, unspecified: Secondary | ICD-10-CM | POA: Diagnosis not present

## 2016-02-26 DIAGNOSIS — N138 Other obstructive and reflux uropathy: Secondary | ICD-10-CM | POA: Diagnosis not present

## 2016-02-26 DIAGNOSIS — N401 Enlarged prostate with lower urinary tract symptoms: Secondary | ICD-10-CM | POA: Diagnosis not present

## 2016-03-09 ENCOUNTER — Emergency Department
Admission: EM | Admit: 2016-03-09 | Discharge: 2016-03-09 | Disposition: A | Discharge status: Home / Self Care | Payer: Worker's Compensation | Attending: Emergency Medicine | Admitting: Emergency Medicine

## 2016-03-09 ENCOUNTER — Encounter: Payer: Self-pay | Admitting: Emergency Medicine

## 2016-03-09 DIAGNOSIS — Z203 Contact with and (suspected) exposure to rabies: Secondary | ICD-10-CM | POA: Diagnosis not present

## 2016-03-09 DIAGNOSIS — Y990 Civilian activity done for income or pay: Secondary | ICD-10-CM | POA: Diagnosis not present

## 2016-03-09 DIAGNOSIS — Y9289 Other specified places as the place of occurrence of the external cause: Secondary | ICD-10-CM | POA: Insufficient documentation

## 2016-03-09 DIAGNOSIS — S81852A Open bite, left lower leg, initial encounter: Secondary | ICD-10-CM | POA: Diagnosis present

## 2016-03-09 DIAGNOSIS — Y9389 Activity, other specified: Secondary | ICD-10-CM | POA: Insufficient documentation

## 2016-03-09 DIAGNOSIS — S81812A Laceration without foreign body, left lower leg, initial encounter: Secondary | ICD-10-CM

## 2016-03-09 DIAGNOSIS — W540XXA Bitten by dog, initial encounter: Secondary | ICD-10-CM | POA: Insufficient documentation

## 2016-03-09 DIAGNOSIS — Z23 Encounter for immunization: Secondary | ICD-10-CM | POA: Diagnosis not present

## 2016-03-09 MED ORDER — AMOXICILLIN-POT CLAVULANATE 875-125 MG PO TABS: 1.0000 | ORAL_TABLET | Freq: Two times a day (BID) | ORAL | 0 refills | Status: DC

## 2016-03-09 MED ORDER — TETANUS-DIPHTH-ACELL PERTUSSIS 5-2.5-18.5 LF-MCG/0.5 IM SUSP
0.5000 mL | Freq: Once | INTRAMUSCULAR | Status: AC
  Administered 2016-03-09: 0.5 mL via INTRAMUSCULAR
  Filled 2016-03-09: qty 0.5

## 2016-03-09 MED ORDER — RABIES VACCINE, PCEC IM SUSR
1.0000 mL | Freq: Once | INTRAMUSCULAR | Status: AC
  Administered 2016-03-09: 1 mL via INTRAMUSCULAR
  Filled 2016-03-09: qty 1

## 2016-03-09 MED ORDER — AMOXICILLIN-POT CLAVULANATE 875-125 MG PO TABS
1.0000 | ORAL_TABLET | Freq: Once | ORAL | Status: AC
  Administered 2016-03-09: 1 via ORAL
  Filled 2016-03-09: qty 1

## 2016-03-09 MED ORDER — RABIES IMMUNE GLOBULIN 150 UNIT/ML IM INJ
20.0000 [IU]/kg | INJECTION | Freq: Once | INTRAMUSCULAR | Status: AC
  Administered 2016-03-09: 1500 [IU] via INTRAMUSCULAR
  Filled 2016-03-09: qty 10

## 2016-03-09 NOTE — ED Provider Notes (Signed)
[facility] Emergency Department Provider Note  ____________________________________________  Time seen: Approximately 6:19 PM  I have reviewed the triage vital signs and the nursing notes.   HISTORY  Chief Complaint Animal Bite    HPI Scott Le is a 65 y.o. male , NAD, presents to the emergency department with lacerations to the left lower leg due to dog bite. 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 Does not believe the dog was vaccinated against rabies. States this incident happened around 2:30 PM and it was reported to his supervisor at [employer]. He arrived at the main office at 5 PM to pick up paper work for [employer] and then presented to the emergency department. Does not believe he has had a tetanus vaccination in the last 5 years. Has noted minimal bleeding from the wound sites. Does note that the left lower leg is painful at the laceration sites but can ambulate. Denies any fevers or chills. Has had no numbness, weakness, tingling.   Past Medical History:  Diagnosis Date  . Arthritis   . Seasonal allergies     There are no active problems to display for this patient.   Past Surgical History:  Procedure Laterality Date  . ROTATOR CUFF REPAIR Right     Prior to Admission medications   Medication Sig Start Date End Date Taking? Authorizing Provider  amoxicillin-clavulanate (AUGMENTIN) 875-125 MG tablet Take 1 tablet by mouth 2 (two) times daily. 03/09/16   Marqui Formby L Naylea Wigington, PA-C  oseltamivir (TAMIFLU) 75 MG capsule Take 1 capsule (75 mg total) by mouth 2 (two) times daily. 08/24/15   Norval Gable, MD    Allergies Ciprofloxacin; Codeine; Levaquin [levofloxacin]; and Nsaids  Family History  Problem Relation Age of Onset  . CAD Father 32    s/p post first MI @ age 52, passed from [place]w @ age 31  . CAD Mother 2    first MI @ age 54, s/p multiple stents  . Valvular heart disease Mother     s/p TAVR    Social History Social History  Substance Use Topics  . Smoking status: Never Smoker  . Smokeless tobacco: Never Used  . Alcohol use No     Review of Systems Constitutional: No fever/chills Musculoskeletal: Positive left lower leg pain at site of dog bites.  Skin: Positive lacerations due to dog bite left lower leg. Negative for rash, redness, swelling, bruising. Neurological: Negative for numbness, weakness, tingling. 10-point ROS otherwise negative.  ____________________________________________   PHYSICAL EXAM:  VITAL SIGNS: ED Triage Vitals  Enc Vitals Group     BP 03/09/16 1808 110/76     Pulse Rate 03/09/16 1808 (!) 58     Resp 03/09/16 1808 18     Temp 03/09/16 1808 98.7 F (37.1 C)     Temp Source 03/09/16 1808 Oral     SpO2 03/09/16 1808 100 %     Weight 03/09/16 1804 164 lb (74.4 kg)     Height 03/09/16 1804 5\' 10"  (1.778 m)     Head Circumference --      Peak Flow --      Pain Score 03/09/16 1804 0     Pain Loc --      Pain Edu? --      Excl. in [place]? --      Constitutional: Alert and oriented.  Well appearing and in no acute distress. Eyes: Conjunctivae are normal.  Head: Atraumatic. Cardiovascular: Good peripheral circulation with 2+ pulses noted in the left lower extremity. Respiratory: Normal respiratory effort without tachypnea or retractions.  Musculoskeletal: No lower extremity tenderness nor edema.  No joint effusions. Full range of motion of the left lower extremity without pain or difficulty. Neurologic:  Normal speech and language. No gross focal neurologic deficits are appreciated.  Skin:  2 superficial abrasions with superficial puncture wounds noted about the left lower extremity without active oozing or weeping. Mild tenderness to palpation of the sites but no underlying swelling or deformity. Skin is warm, dry and intact. No rash, redness, abnormal  warmth, bruising noted. Psychiatric: Mood and affect are normal. Speech and behavior are normal. Patient exhibits appropriate insight and judgement.   ____________________________________________   LABS  None ____________________________________________  EKG  None ____________________________________________  RADIOLOGY  None ____________________________________________    PROCEDURES  Procedure(s) performed: None   Procedures   Medications  Tdap (BOOSTRIX) injection 0.5 mL (0.5 mLs Intramuscular Given 03/09/16 1853)  rabies vaccine (RABAVERT) injection 1 mL (1 mL Intramuscular Given 03/09/16 2046)  rabies immune globulin (HYPERAB) injection 1,500 Units (1,500 Units Intramuscular Given 03/09/16 2047)     ____________________________________________   INITIAL IMPRESSION / ASSESSMENT AND PLAN / ED COURSE  Pertinent labs & imaging results that were available during my care of the patient were reviewed by me and considered in my medical decision making (see chart for details).  Clinical Course  Comment By Time  [place] police has just informed me that the dog has been seized from the home and will be monitored by animal control in quarantine over the next 10 days. Animal control has said that if the dog shows signs of rabies then the patient may return to the emergency department for rabies post exposure prophylactic vaccinations. Information will be given to the patient for him to make an educated decision in regards to starting rabies postexposure prophylactic vaccinations tonight or he may wait per the information given to him by animal control. Braxton Feathers, PA-C 10/03 1901  Patient has opted to move forward with rabies postexposure prophylactic vaccination. Braxton Feathers, PA-C 10/03 1922    Patient's diagnosis is consistent with Laceration of the left lower leg due to dog bite and need for postexposure rabies prophylaxis and tetanus booster. Patient will be  discharged home with prescriptions for Augmentin to take as directed. Patient is to keep wounds clean and dry as well as covered until fully healed. Patient is to follow up with [facility] in 3 days for second rabies vaccination as well as wound recheck. He is then to follow-up with the [facility] on day 7 and 14 for third and fourth rabies vaccinations in the series. Patient was given a note that he may return to work on 03/11/2016 without restrictions.  Patient is given ED precautions to return to the ED for any worsening or new symptoms.    ____________________________________________  FINAL CLINICAL IMPRESSION(S) / ED DIAGNOSES  Final diagnoses:  Laceration of left lower leg, initial encounter  Dog bite, initial encounter  Need for post exposure prophylaxis for rabies  Need for tetanus booster      NEW MEDICATIONS STARTED DURING THIS VISIT:  New Prescriptions   AMOXICILLIN-CLAVULANATE (AUGMENTIN) 875-125 MG TABLET    Take 1 tablet by mouth 2 (two) times daily.  Braxton Feathers, PA-C 03/09/16 2053    Nena Polio, MD 03/09/16 [id number]

## 2016-03-09 NOTE — ED Notes (Signed)
Officer talking to patient at this time

## 2016-03-09 NOTE — ED Triage Notes (Signed)
Pt to ed with c/o dog bite to left lower leg.  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

## 2016-03-09 NOTE — ED Notes (Signed)
See triage note  States he was bitten by a Pit to left lower leg  Area is ender and swollen  Puncture wounds noted

## 2016-03-09 NOTE — Discharge Instructions (Signed)
Keep wounds clean, dry and covered until fully healed

## 2016-03-12 ENCOUNTER — Ambulatory Visit
Admission: EM | Admit: 2016-03-12 | Discharge: 2016-03-12 | Disposition: A | Discharge status: Home / Self Care | Payer: Worker's Compensation | Attending: Family Medicine | Admitting: Family Medicine

## 2016-03-12 ENCOUNTER — Encounter: Payer: Self-pay | Admitting: *Deleted

## 2016-03-12 ENCOUNTER — Ambulatory Visit
Admission: EM | Admit: 2016-03-12 | Discharge: 2016-03-12 | Disposition: A | Discharge status: Hospice | Payer: Worker's Compensation

## 2016-03-12 DIAGNOSIS — Z203 Contact with and (suspected) exposure to rabies: Secondary | ICD-10-CM | POA: Diagnosis not present

## 2016-03-12 MED ORDER — RABIES VACCINE, PCEC IM SUSR
1.0000 mL | Freq: Once | INTRAMUSCULAR | Status: AC
  Administered 2016-03-12: 1 mL via INTRAMUSCULAR

## 2016-03-12 NOTE — ED Triage Notes (Signed)
Here for 2nd rabies shot 

## 2016-03-16 ENCOUNTER — Emergency Department
Admission: EM | Admit: 2016-03-16 | Discharge: 2016-03-16 | Disposition: A | Discharge status: Home / Self Care | Payer: Worker's Compensation | Attending: Emergency Medicine | Admitting: Emergency Medicine

## 2016-03-16 ENCOUNTER — Ambulatory Visit
Admission: EM | Admit: 2016-03-16 | Discharge: 2016-03-16 | Disposition: A | Discharge status: Home / Self Care | Payer: Worker's Compensation

## 2016-03-16 DIAGNOSIS — Z23 Encounter for immunization: Secondary | ICD-10-CM

## 2016-03-16 DIAGNOSIS — Z79899 Other long term (current) drug therapy: Secondary | ICD-10-CM | POA: Insufficient documentation

## 2016-03-16 DIAGNOSIS — Z203 Contact with and (suspected) exposure to rabies: Secondary | ICD-10-CM | POA: Insufficient documentation

## 2016-03-16 MED ORDER — RABIES VACCINE, PCEC IM SUSR
1.0000 mL | Freq: Once | INTRAMUSCULAR | Status: AC
  Administered 2016-03-16: 1 mL via INTRAMUSCULAR
  Filled 2016-03-16: qty 1

## 2016-03-16 NOTE — ED Triage Notes (Signed)
Pt is here for rabies injection #3

## 2016-03-16 NOTE — ED Provider Notes (Signed)
[facility] Emergency Department Provider Note   ____________________________________________   None    (approximate)  I have reviewed the triage vital signs and the nursing notes.   HISTORY  Chief Complaint Rabies Injection    HPI Scott Le is a 65 y.o. male presents for rabies vaccination #3 and a series of 4. Denies any complaints other than occasional muscle aches but did not request any medications for this.   Past Medical History:  Diagnosis Date  . Arthritis   . Seasonal allergies     There are no active problems to display for this patient.   Past Surgical History:  Procedure Laterality Date  . ROTATOR CUFF REPAIR Right     Prior to Admission medications   Medication Sig Start Date End Date Taking? Authorizing Provider  amoxicillin-clavulanate (AUGMENTIN) 875-125 MG tablet Take 1 tablet by mouth 2 (two) times daily. 03/09/16   Jami L Hagler, PA-C  oseltamivir (TAMIFLU) 75 MG capsule Take 1 capsule (75 mg total) by mouth 2 (two) times daily. 08/24/15   Norval Gable, MD    Allergies Ciprofloxacin; Codeine; Levaquin [levofloxacin]; and Nsaids  Family History  Problem Relation Age of Onset  . CAD Father 42    s/p post first MI @ age 62, passed from [place] @ age 35  . CAD Mother 60    first MI @ age 72, s/p multiple stents  . Valvular heart disease Mother     s/p TAVR    Social History Social History  Substance Use Topics  . Smoking status: Never Smoker  . Smokeless tobacco: Never Used  . Alcohol use No    Review of Systems Constitutional: No fever/chills Cardiovascular: Denies chest pain. Respiratory: Denies shortness of breath. Skin: Negative for rash. Neurological: Negative for headaches, focal weakness or numbness.  10-point ROS otherwise negative.  ____________________________________________   PHYSICAL EXAM:  VITAL SIGNS: ED Triage Vitals  Enc Vitals Group     BP --      Pulse Rate 03/16/16 1421 78      Resp 03/16/16 1421 17     Temp --      Temp src --      SpO2 03/16/16 1421 99 %     Weight 03/16/16 1347 162 lb (73.5 kg)     Height 03/16/16 1347 5\' 8"  (1.727 m)     Head Circumference --      Peak Flow --      Pain Score 03/16/16 1347 0     Pain Loc --      Pain Edu? --      Excl. in [place]? --     Constitutional: Alert and oriented. Well appearing and in no acute distress. Cardiovascular: Normal rate, regular rhythm. Grossly normal heart sounds.  Good peripheral circulation. Respiratory: Normal respiratory effort.  No retractions. Lungs CTAB. Musculoskeletal: No lower extremity tenderness nor edema.  No joint effusions. Neurologic:  Normal speech and language. No gross focal neurologic deficits are appreciated. No gait instability. Skin:  Skin is warm, dry and intact. No rash noted. Psychiatric: Mood and affect are normal. Speech and behavior are normal.  ____________________________________________   LABS (all labs ordered are listed, but only abnormal results are displayed)  Labs Reviewed - No data to display ____________________________________________  EKG   ____________________________________________  RADIOLOGY   ____________________________________________   PROCEDURES  Procedure(s) performed: None  Procedures  Critical Care performed: No  ____________________________________________   INITIAL IMPRESSION / ASSESSMENT AND PLAN /  ED COURSE  Pertinent labs & imaging results that were available during my care of the patient were reviewed by me and considered in my medical decision making (see chart for details).  Rabies vaccination #3 and a series of 4. Patient denies any complaints this time will follow-up in one week for final injection.  Clinical Course     ____________________________________________   FINAL CLINICAL IMPRESSION(S) / ED DIAGNOSES  Final diagnoses:  Need for rabies vaccination      NEW MEDICATIONS STARTED DURING THIS  VISIT:  Discharge Medication List as of 03/16/2016  2:09 PM       Note:  This document was prepared using Dragon voice recognition software and may include unintentional dictation errors.   Arlyss Repress, PA-C 03/16/16 1442    Lavonia Drafts, MD 03/16/16 [phone]

## 2016-03-16 NOTE — ED Notes (Signed)
Pt is here for #3 rabies injection

## 2016-03-23 ENCOUNTER — Ambulatory Visit
Admission: EM | Admit: 2016-03-23 | Discharge: 2016-03-23 | Disposition: A | Discharge status: Home / Self Care | Payer: Worker's Compensation | Attending: Family Medicine | Admitting: Family Medicine

## 2016-03-23 ENCOUNTER — Encounter: Payer: Self-pay | Admitting: *Deleted

## 2016-03-23 DIAGNOSIS — Z203 Contact with and (suspected) exposure to rabies: Secondary | ICD-10-CM

## 2016-03-23 MED ORDER — RABIES VACCINE, PCEC IM SUSR
1.0000 mL | Freq: Once | INTRAMUSCULAR | Status: AC
  Administered 2016-03-23: 1 mL via INTRAMUSCULAR

## 2016-03-23 NOTE — ED Triage Notes (Signed)
Here for last Rabies injection.

## 2016-04-11 DIAGNOSIS — R69 Illness, unspecified: Secondary | ICD-10-CM | POA: Diagnosis not present

## 2017-02-14 DIAGNOSIS — L578 Other skin changes due to chronic exposure to nonionizing radiation: Secondary | ICD-10-CM | POA: Diagnosis not present

## 2017-02-14 DIAGNOSIS — L720 Epidermal cyst: Secondary | ICD-10-CM | POA: Diagnosis not present

## 2017-02-14 DIAGNOSIS — D229 Melanocytic nevi, unspecified: Secondary | ICD-10-CM | POA: Diagnosis not present

## 2017-02-14 DIAGNOSIS — L821 Other seborrheic keratosis: Secondary | ICD-10-CM | POA: Diagnosis not present

## 2017-02-14 DIAGNOSIS — L739 Follicular disorder, unspecified: Secondary | ICD-10-CM | POA: Diagnosis not present

## 2017-02-14 DIAGNOSIS — L812 Freckles: Secondary | ICD-10-CM | POA: Diagnosis not present

## 2017-02-14 DIAGNOSIS — D1800 Hemangioma unspecified site: Secondary | ICD-10-CM | POA: Diagnosis not present

## 2017-02-14 DIAGNOSIS — Z1283 Encounter for screening for malignant neoplasm of skin: Secondary | ICD-10-CM | POA: Diagnosis not present

## 2017-02-20 DIAGNOSIS — R69 Illness, unspecified: Secondary | ICD-10-CM | POA: Diagnosis not present

## 2017-03-06 DIAGNOSIS — R69 Illness, unspecified: Secondary | ICD-10-CM | POA: Diagnosis not present

## 2017-03-25 DIAGNOSIS — H1131 Conjunctival hemorrhage, right eye: Secondary | ICD-10-CM | POA: Diagnosis not present

## 2017-05-04 DIAGNOSIS — H43813 Vitreous degeneration, bilateral: Secondary | ICD-10-CM | POA: Diagnosis not present

## 2017-09-08 DIAGNOSIS — N138 Other obstructive and reflux uropathy: Secondary | ICD-10-CM | POA: Diagnosis not present

## 2017-09-08 DIAGNOSIS — N320 Bladder-neck obstruction: Secondary | ICD-10-CM | POA: Diagnosis not present

## 2017-09-08 DIAGNOSIS — N401 Enlarged prostate with lower urinary tract symptoms: Secondary | ICD-10-CM | POA: Diagnosis not present

## 2017-09-08 DIAGNOSIS — E291 Testicular hypofunction: Secondary | ICD-10-CM | POA: Diagnosis not present

## 2017-09-08 DIAGNOSIS — I861 Scrotal varices: Secondary | ICD-10-CM | POA: Diagnosis not present

## 2017-09-08 DIAGNOSIS — N4340 Spermatocele of epididymis, unspecified: Secondary | ICD-10-CM | POA: Diagnosis not present

## 2017-09-08 DIAGNOSIS — N500 Atrophy of testis: Secondary | ICD-10-CM | POA: Diagnosis not present

## 2018-01-17 ENCOUNTER — Ambulatory Visit (INDEPENDENT_AMBULATORY_CARE_PROVIDER_SITE_OTHER): Payer: Medicare HMO | Admitting: Nurse Practitioner

## 2018-01-17 ENCOUNTER — Encounter (INDEPENDENT_AMBULATORY_CARE_PROVIDER_SITE_OTHER): Payer: Self-pay | Admitting: Vascular Surgery

## 2018-01-17 VITALS — BP 127/76 | HR 56 | Resp 16 | Ht 68.0 in | Wt 163.8 lb

## 2018-01-17 DIAGNOSIS — M1990 Unspecified osteoarthritis, unspecified site: Secondary | ICD-10-CM | POA: Diagnosis not present

## 2018-01-17 DIAGNOSIS — E785 Hyperlipidemia, unspecified: Secondary | ICD-10-CM | POA: Diagnosis not present

## 2018-01-17 DIAGNOSIS — I83813 Varicose veins of bilateral lower extremities with pain: Secondary | ICD-10-CM

## 2018-01-17 NOTE — Progress Notes (Signed)
Subjective:    Patient ID: Scott Le, male    DOB: 12-Jan-1951, 67 y.o.   MRN: [id number] Chief Complaint  Patient presents with  . New Patient (Initial Visit)    ref Varicose Veins    HPI  The patient is seen for evaluation of symptomatic varicose veins. The patient relates burning and stinging which worsened steadily throughout the course of the day, particularly with standing. The patient also notes an aching and throbbing pain over the varicosities, particularly with prolonged dependent positions. The symptoms are significantly improved with elevation.  The patient also notes that during hot weather the symptoms are greatly intensified. The patient states the pain from the varicose veins interferes with work, daily exercise, shopping and household maintenance. At this point, the symptoms are persistent and severe enough that they're having a negative impact on lifestyle and are interfering with daily activities.  There is no history of DVT, PE or superficial thrombophlebitis. There is no history of ulceration or hemorrhage. The patient endorses a family history of varicose veins, namely his mother.  The patient has not worn graduated compression in the past. At the present time the patient has not been using over-the-counter analgesics. There is no history of prior surgical intervention or sclerotherapy.    Constitutional: [] Weight loss  [] Fever  [] Chills Cardiac: [] Chest pain   [] Chest pressure   [] Palpitations   [] Shortness of breath when laying flat   [] Shortness of breath with exertion. Vascular:  [x] Pain in legs with walking   [] Pain in legs with standing  [] History of DVT   [] Phlebitis   [] Swelling in legs   [x] Varicose veins   [] Non-healing ulcers Pulmonary:   [] Uses home oxygen   [] Productive cough   [] Hemoptysis   [] Wheeze  [] COPD   [] Asthma Neurologic:  [] Dizziness   [] Seizures   [] History of stroke   [] History of TIA  [] Aphasia   [] Vissual changes   [] Weakness or  numbness in arm   [] Weakness or numbness in leg Musculoskeletal:   [] Joint swelling   [] Joint pain   [] Low back pain Hematologic:  [] Easy bruising  [] Easy bleeding   [] Hypercoagulable state   [] Anemic Gastrointestinal:  [] Diarrhea   [] Vomiting  [] Gastroesophageal reflux/heartburn   [] Difficulty swallowing. Genitourinary:  [] Chronic kidney disease   [] Difficult urination  [] Frequent urination   [] Blood in urine Skin:  [] Rashes   [] Ulcers  Psychological:  [] History of anxiety   []  History of major depression.     Objective:   Physical Exam  BP 127/76 (BP Location: Right Arm)   Pulse (!) 56   Resp 16   Ht 5\' 8"  (1.727 m)   Wt 163 lb 12.8 oz (74.3 kg)   BMI 24.91 kg/m   Past Medical History:  Diagnosis Date  . Arthritis   . Seasonal allergies      Gen: WD/WN, NAD Head: [place]/AT, No temporalis wasting.  Ear/Nose/Throat: Hearing grossly intact, nares w/o erythema or drainage, poor dentition Eyes: PER, EOMI, sclera nonicteric.  Neck: Supple, no masses.  No bruit or JVD.  Pulmonary:  Good air movement, clear to auscultation bilaterally, no use of accessory muscles.  Cardiac: RRR, normal S1, S2, no Murmurs. Vascular: Multiple scattered varicosities on bilateral lower extremities Vessel Right Left  DP palpable Palpable   Gastrointestinal: soft, non-distended. No guarding/no peritoneal signs.  Musculoskeletal: M/S 5/5 throughout.  No deformity or atrophy.  Neurologic: CN 2-12 intact. Pain and light touch intact in extremities.  Symmetrical.  Speech is fluent. Motor exam  as listed above. Psychiatric: Judgment intact, Mood & affect appropriate for pt's clinical situation. Dermatologic: no Venous rashes no ulcers noted.  No changes consistent with cellulitis. Lymph : No Cervical lymphadenopathy, no lichenification or skin changes of chronic lymphedema.   Social History   Socioeconomic History  . Marital status: Married    Spouse name: Not on file  . Number of children: Not on file    . Years of education: Not on file  . Highest education level: Not on file  Occupational History  . Not on file  Social Needs  . Financial resource strain: Not on file  . Food insecurity:    Worry: Not on file    Inability: Not on file  . Transportation needs:    Medical: Not on file    Non-medical: Not on file  Tobacco Use  . Smoking status: Never Smoker  . Smokeless tobacco: Never Used  Substance and Sexual Activity  . Alcohol use: No    Alcohol/week: 0.0 standard drinks  . Drug use: No  . Sexual activity: Not on file  Lifestyle  . Physical activity:    Days per week: Not on file    Minutes per session: Not on file  . Stress: Not on file  Relationships  . Social connections:    Talks on phone: Not on file    Gets together: Not on file    Attends religious service: Not on file    Active member of club or organization: Not on file    Attends meetings of clubs or organizations: Not on file    Relationship status: Not on file  . Intimate partner violence:    Fear of current or ex partner: Not on file    Emotionally abused: Not on file    Physically abused: Not on file    Forced sexual activity: Not on file  Other Topics Concern  . Not on file  Social History Narrative  . Not on file    Past Surgical History:  Procedure Laterality Date  . ROTATOR CUFF REPAIR Right     Family History  Problem Relation Age of Onset  . CAD Father 52       s/p post first MI @ age 19, passed from [employer] @ age 3  . CAD Mother 46       first MI @ age 72, s/p multiple stents  . Valvular heart disease Mother        s/p TAVR    Allergies  Allergen Reactions  . Ciprofloxacin Other (See Comments)  . Codeine Nausea And Vomiting  . Levaquin [Levofloxacin] Other (See Comments)    Joint pain  . Nsaids Other (See Comments)    Bleeding gums/gum issues       Assessment & Plan:   1. Varicose veins of bilateral lower extremities with pain   Recommend:  The patient has large  symptomatic varicose veins that are painful and associated with swelling.  I have had a long discussion with the patient regarding  varicose veins and why they cause symptoms.  Patient will begin wearing graduated compression stockings class 1 on a daily basis, beginning first thing in the morning and removing them in the evening. The patient is instructed specifically not to sleep in the stockings.    The patient  will also begin using over-the-counter analgesics such as Motrin 600 mg po TID to help control the symptoms.    In addition, behavioral modification including elevation during the day  will be initiated.    Pending the results of these changes the  patient will be reevaluated in three months.   An  ultrasound of the venous system will be obtained.   Further plans will be based on the ultrasound results and whether conservative therapies are successful at eliminating the pain and swelling.   - VAS [place] LOWER EXTREMITY VENOUS REFLUX; Future  2. Arthritis Patient takes OTC Tylenol and states it is well controlled.    3. Hyperlipidemia Well controlled with Diet and exercise   Current Outpatient Medications on File Prior to Visit  Medication Sig Dispense Refill  . amoxicillin-clavulanate (AUGMENTIN) 875-125 MG tablet Take 1 tablet by mouth 2 (two) times daily. (Patient not taking: Reported on 01/17/2018) 14 tablet 0  . oseltamivir (TAMIFLU) 75 MG capsule Take 1 capsule (75 mg total) by mouth 2 (two) times daily. (Patient not taking: Reported on 01/17/2018) 10 capsule 0   No current facility-administered medications on file prior to visit.     There are no Patient Instructions on file for this visit. No follow-ups on file.   Kris Hartmann, NP

## 2018-02-06 DIAGNOSIS — R69 Illness, unspecified: Secondary | ICD-10-CM | POA: Diagnosis not present

## 2018-02-22 DIAGNOSIS — D2230 Melanocytic nevi of unspecified part of face: Secondary | ICD-10-CM | POA: Diagnosis not present

## 2018-02-22 DIAGNOSIS — D485 Neoplasm of uncertain behavior of skin: Secondary | ICD-10-CM | POA: Diagnosis not present

## 2018-02-22 DIAGNOSIS — D2260 Melanocytic nevi of unspecified upper limb, including shoulder: Secondary | ICD-10-CM | POA: Diagnosis not present

## 2018-02-22 DIAGNOSIS — D2270 Melanocytic nevi of unspecified lower limb, including hip: Secondary | ICD-10-CM | POA: Diagnosis not present

## 2018-02-22 DIAGNOSIS — L821 Other seborrheic keratosis: Secondary | ICD-10-CM | POA: Diagnosis not present

## 2018-02-22 DIAGNOSIS — Z1283 Encounter for screening for malignant neoplasm of skin: Secondary | ICD-10-CM | POA: Diagnosis not present

## 2018-02-22 DIAGNOSIS — D225 Melanocytic nevi of trunk: Secondary | ICD-10-CM | POA: Diagnosis not present

## 2018-02-22 DIAGNOSIS — L720 Epidermal cyst: Secondary | ICD-10-CM | POA: Diagnosis not present

## 2018-02-22 DIAGNOSIS — D1800 Hemangioma unspecified site: Secondary | ICD-10-CM | POA: Diagnosis not present

## 2018-02-22 DIAGNOSIS — L812 Freckles: Secondary | ICD-10-CM | POA: Diagnosis not present

## 2018-02-22 DIAGNOSIS — L820 Inflamed seborrheic keratosis: Secondary | ICD-10-CM | POA: Diagnosis not present

## 2018-03-04 DIAGNOSIS — R69 Illness, unspecified: Secondary | ICD-10-CM | POA: Diagnosis not present

## 2018-05-30 ENCOUNTER — Ambulatory Visit (INDEPENDENT_AMBULATORY_CARE_PROVIDER_SITE_OTHER): Payer: Medicare HMO | Admitting: Vascular Surgery

## 2018-05-30 ENCOUNTER — Encounter (INDEPENDENT_AMBULATORY_CARE_PROVIDER_SITE_OTHER): Payer: Medicare HMO

## 2018-06-06 ENCOUNTER — Ambulatory Visit (INDEPENDENT_AMBULATORY_CARE_PROVIDER_SITE_OTHER): Payer: Medicare HMO

## 2018-06-06 ENCOUNTER — Other Ambulatory Visit: Payer: Self-pay

## 2018-06-06 ENCOUNTER — Encounter (INDEPENDENT_AMBULATORY_CARE_PROVIDER_SITE_OTHER): Payer: Self-pay | Admitting: Vascular Surgery

## 2018-06-06 ENCOUNTER — Ambulatory Visit (INDEPENDENT_AMBULATORY_CARE_PROVIDER_SITE_OTHER): Payer: Medicare HMO | Admitting: Vascular Surgery

## 2018-06-06 VITALS — BP 131/82 | HR 84 | Resp 14 | Ht 68.0 in | Wt 163.0 lb

## 2018-06-06 DIAGNOSIS — E785 Hyperlipidemia, unspecified: Secondary | ICD-10-CM

## 2018-06-06 DIAGNOSIS — M79605 Pain in left leg: Secondary | ICD-10-CM

## 2018-06-06 DIAGNOSIS — Z23 Encounter for immunization: Secondary | ICD-10-CM | POA: Insufficient documentation

## 2018-06-06 DIAGNOSIS — M79604 Pain in right leg: Secondary | ICD-10-CM

## 2018-06-06 DIAGNOSIS — M79609 Pain in unspecified limb: Secondary | ICD-10-CM | POA: Insufficient documentation

## 2018-06-06 DIAGNOSIS — I83813 Varicose veins of bilateral lower extremities with pain: Secondary | ICD-10-CM | POA: Diagnosis not present

## 2018-06-06 DIAGNOSIS — Z0000 Encounter for general adult medical examination without abnormal findings: Secondary | ICD-10-CM | POA: Insufficient documentation

## 2018-06-06 DIAGNOSIS — M67449 Ganglion, unspecified hand: Secondary | ICD-10-CM | POA: Insufficient documentation

## 2018-06-06 NOTE — Assessment & Plan Note (Signed)
His venous reflux study today demonstrated no evidence of DVT or superficial thrombophlebitis.  Superficial venous reflux was not identified with only some deep venous reflux identified in both lower extremities. It does not appear as if venous insufficiency is the primary cause of his lower extremity symptoms.  No role for intervention/venous ablation given these findings.  At this point, I will see the patient back on an as-needed basis.

## 2018-06-06 NOTE — Progress Notes (Signed)
MRN : [id number]  Scott Le is a 67 y.o. (1950/09/29) male who presents with chief complaint of  Chief Complaint  Patient presents with  . Follow-up  .  History of Present Illness: Patient returns today in follow up of leg pain.  Compression stockings did not really help the pain and irritation in his legs.  In fact, he said they actually made them worse.  No new ulceration or infection. His venous reflux study today demonstrated no evidence of DVT or superficial thrombophlebitis.  Superficial venous reflux was not identified with only some deep venous reflux identified in both lower extremities.  Current Outpatient Medications  Medication Sig Dispense Refill  . Multiple Vitamin (MULTIVITAMIN) tablet Take 1 tablet by mouth daily.     No current facility-administered medications for this visit.     Past Medical History:  Diagnosis Date  . Arthritis   . Seasonal allergies     Past Surgical History:  Procedure Laterality Date  . ROTATOR CUFF REPAIR Right     Social History Social History   Tobacco Use  . Smoking status: Never Smoker  . Smokeless tobacco: Never Used  Substance Use Topics  . Alcohol use: No    Alcohol/week: 0.0 standard drinks  . Drug use: No     Family History Family History  Problem Relation Age of Onset  . CAD Father 2       s/p post first MI @ age 81, passed from [place] @ age 13  . CAD Mother 36       first MI @ age 63, s/p multiple stents  . Valvular heart disease Mother        s/p TAVR     Allergies  Allergen Reactions  . Ciprofloxacin Other (See Comments)  . Codeine Nausea And Vomiting  . Levaquin [Levofloxacin] Other (See Comments)    Joint pain  . Nsaids Other (See Comments)    Bleeding gums/gum issues     REVIEW OF SYSTEMS (Negative unless checked)  Constitutional: [] Weight loss  [] Fever  [] Chills Cardiac: [] Chest pain   [] Chest pressure   [] Palpitations   [] Shortness of breath when laying flat   [] Shortness of breath  at rest   [] Shortness of breath with exertion. Vascular:  [x] Pain in legs with walking   [] Pain in legs at rest   [] Pain in legs when laying flat   [] Claudication   [] Pain in feet when walking  [] Pain in feet at rest  [] Pain in feet when laying flat   [] History of DVT   [] Phlebitis   [] Swelling in legs   [x] Varicose veins   [] Non-healing ulcers Pulmonary:   [] Uses home oxygen   [] Productive cough   [] Hemoptysis   [] Wheeze  [] COPD   [] Asthma Neurologic:  [] Dizziness  [] Blackouts   [] Seizures   [] History of stroke   [] History of TIA  [] Aphasia   [] Temporary blindness   [] Dysphagia   [] Weakness or numbness in arms   [] Weakness or numbness in legs Musculoskeletal:  [x] Arthritis   [] Joint swelling   [] Joint pain   [] Low back pain Hematologic:  [] Easy bruising  [] Easy bleeding   [] Hypercoagulable state   [] Anemic   Gastrointestinal:  [] Blood in stool   [] Vomiting blood  [] Gastroesophageal reflux/heartburn   [] Abdominal pain Genitourinary:  [] Chronic kidney disease   [] Difficult urination  [] Frequent urination  [] Burning with urination   [] Hematuria Skin:  [] Rashes   [] Ulcers   [] Wounds Psychological:  [] History of anxiety   []  History  of major depression.  Physical Examination  BP 131/82 (BP Location: Left Arm, Patient Position: Sitting)   Pulse 84   Resp 14   Ht 5\' 8"  (1.727 m)   Wt 163 lb (73.9 kg)   BMI 24.78 kg/m  Gen:  WD/WN, NAD Head: [place]/AT, No temporalis wasting. Ear/Nose/Throat: Hearing grossly intact, nares w/o erythema or drainage Eyes: Conjunctiva clear. Sclera non-icteric Neck: Supple.  Trachea midline Pulmonary:  Good air movement, no use of accessory muscles.  Cardiac: RRR, no JVD Vascular:  Vessel Right Left  Radial Palpable Palpable                       Musculoskeletal: M/S 5/5 throughout.  No deformity or atrophy.  No appreciable edema. Neurologic: Sensation grossly intact in extremities.  Symmetrical.  Speech is fluent.  Psychiatric: Judgment intact, Mood & affect  appropriate for pt's clinical situation. Dermatologic: No rashes or ulcers noted.  No cellulitis or open wounds.       Labs No results found for this or any previous visit (from the past 2160 hour(s)).  Radiology No results found.  Assessment/Plan  Hyperlipidemia lipid control important in reducing the progression of atherosclerotic disease.    Pain in limb His venous reflux study today demonstrated no evidence of DVT or superficial thrombophlebitis.  Superficial venous reflux was not identified with only some deep venous reflux identified in both lower extremities. It does not appear as if venous insufficiency is the primary cause of his lower extremity symptoms.  No role for intervention/venous ablation given these findings.  At this point, I will see the patient back on an as-needed basis.    Leotis Pain, MD  06/06/2018 11:39 AM    This note was created with Dragon medical transcription system.  Any errors from dictation are purely unintentional

## 2018-06-06 NOTE — Assessment & Plan Note (Signed)
lipid control important in reducing the progression of atherosclerotic disease.   

## 2018-07-06 DIAGNOSIS — H43813 Vitreous degeneration, bilateral: Secondary | ICD-10-CM | POA: Diagnosis not present

## 2018-08-02 DIAGNOSIS — D2372 Other benign neoplasm of skin of left lower limb, including hip: Secondary | ICD-10-CM | POA: Diagnosis not present

## 2018-08-02 DIAGNOSIS — L820 Inflamed seborrheic keratosis: Secondary | ICD-10-CM | POA: Diagnosis not present

## 2018-08-02 DIAGNOSIS — D1801 Hemangioma of skin and subcutaneous tissue: Secondary | ICD-10-CM | POA: Diagnosis not present

## 2019-03-01 DIAGNOSIS — L810 Postinflammatory hyperpigmentation: Secondary | ICD-10-CM | POA: Diagnosis not present

## 2019-03-01 DIAGNOSIS — Z1283 Encounter for screening for malignant neoplasm of skin: Secondary | ICD-10-CM | POA: Diagnosis not present

## 2019-03-01 DIAGNOSIS — L720 Epidermal cyst: Secondary | ICD-10-CM | POA: Diagnosis not present

## 2019-03-01 DIAGNOSIS — D1800 Hemangioma unspecified site: Secondary | ICD-10-CM | POA: Diagnosis not present

## 2019-03-01 DIAGNOSIS — R69 Illness, unspecified: Secondary | ICD-10-CM | POA: Diagnosis not present

## 2019-03-01 DIAGNOSIS — D225 Melanocytic nevi of trunk: Secondary | ICD-10-CM | POA: Diagnosis not present

## 2019-03-01 DIAGNOSIS — L814 Other melanin hyperpigmentation: Secondary | ICD-10-CM | POA: Diagnosis not present

## 2019-03-02 DIAGNOSIS — Z881 Allergy status to other antibiotic agents status: Secondary | ICD-10-CM | POA: Diagnosis not present

## 2019-03-02 DIAGNOSIS — Z125 Encounter for screening for malignant neoplasm of prostate: Secondary | ICD-10-CM | POA: Diagnosis not present

## 2019-03-02 DIAGNOSIS — M1990 Unspecified osteoarthritis, unspecified site: Secondary | ICD-10-CM | POA: Diagnosis not present

## 2019-03-02 DIAGNOSIS — R351 Nocturia: Secondary | ICD-10-CM | POA: Diagnosis not present

## 2019-03-02 DIAGNOSIS — N138 Other obstructive and reflux uropathy: Secondary | ICD-10-CM | POA: Diagnosis not present

## 2019-03-02 DIAGNOSIS — R972 Elevated prostate specific antigen [PSA]: Secondary | ICD-10-CM | POA: Diagnosis not present

## 2019-03-02 DIAGNOSIS — N23 Unspecified renal colic: Secondary | ICD-10-CM | POA: Diagnosis not present

## 2019-03-02 DIAGNOSIS — I861 Scrotal varices: Secondary | ICD-10-CM | POA: Diagnosis not present

## 2019-03-02 DIAGNOSIS — N401 Enlarged prostate with lower urinary tract symptoms: Secondary | ICD-10-CM | POA: Diagnosis not present

## 2019-03-02 DIAGNOSIS — R350 Frequency of micturition: Secondary | ICD-10-CM | POA: Diagnosis not present

## 2019-03-02 DIAGNOSIS — Z885 Allergy status to narcotic agent status: Secondary | ICD-10-CM | POA: Diagnosis not present

## 2019-03-13 DIAGNOSIS — D485 Neoplasm of uncertain behavior of skin: Secondary | ICD-10-CM | POA: Diagnosis not present

## 2019-03-13 DIAGNOSIS — L720 Epidermal cyst: Secondary | ICD-10-CM | POA: Diagnosis not present

## 2019-03-20 DIAGNOSIS — L720 Epidermal cyst: Secondary | ICD-10-CM | POA: Diagnosis not present

## 2019-03-20 DIAGNOSIS — Z4802 Encounter for removal of sutures: Secondary | ICD-10-CM | POA: Diagnosis not present

## 2019-04-24 DIAGNOSIS — M5416 Radiculopathy, lumbar region: Secondary | ICD-10-CM | POA: Diagnosis not present

## 2019-04-24 DIAGNOSIS — M5136 Other intervertebral disc degeneration, lumbar region: Secondary | ICD-10-CM | POA: Diagnosis not present

## 2019-05-29 DIAGNOSIS — M5416 Radiculopathy, lumbar region: Secondary | ICD-10-CM | POA: Diagnosis not present

## 2019-05-30 DIAGNOSIS — M5416 Radiculopathy, lumbar region: Secondary | ICD-10-CM | POA: Diagnosis not present

## 2019-06-06 DIAGNOSIS — B029 Zoster without complications: Secondary | ICD-10-CM | POA: Diagnosis not present

## 2019-06-15 ENCOUNTER — Encounter: Payer: Self-pay | Admitting: Family Medicine

## 2019-06-18 ENCOUNTER — Other Ambulatory Visit: Payer: Self-pay

## 2019-06-18 ENCOUNTER — Encounter: Payer: Self-pay | Admitting: Physician Assistant

## 2019-06-18 ENCOUNTER — Ambulatory Visit (INDEPENDENT_AMBULATORY_CARE_PROVIDER_SITE_OTHER): Payer: Medicare HMO | Admitting: Physician Assistant

## 2019-06-18 VITALS — BP 122/72 | Temp 97.1°F | Ht 68.0 in | Wt 167.6 lb

## 2019-06-18 DIAGNOSIS — Z1159 Encounter for screening for other viral diseases: Secondary | ICD-10-CM

## 2019-06-18 DIAGNOSIS — Z0000 Encounter for general adult medical examination without abnormal findings: Secondary | ICD-10-CM

## 2019-06-18 DIAGNOSIS — Z1329 Encounter for screening for other suspected endocrine disorder: Secondary | ICD-10-CM | POA: Diagnosis not present

## 2019-06-18 DIAGNOSIS — E782 Mixed hyperlipidemia: Secondary | ICD-10-CM | POA: Diagnosis not present

## 2019-06-18 DIAGNOSIS — Z130 Encounter for screening for diseases of the blood and blood-forming organs and certain disorders involving the immune mechanism: Secondary | ICD-10-CM

## 2019-06-18 DIAGNOSIS — B0229 Other postherpetic nervous system involvement: Secondary | ICD-10-CM

## 2019-06-18 DIAGNOSIS — Z131 Encounter for screening for diabetes mellitus: Secondary | ICD-10-CM | POA: Diagnosis not present

## 2019-06-18 NOTE — Progress Notes (Signed)
Patient: Scott Le, Male    DOB: [birth year], 69 y.o.   MRN: [id number] Visit Date: 06/18/2019  Today's Provider: Trinna Post, PA-C   Chief Complaint  Patient presents with  . New Patient (Initial Visit)   Subjective:     New patient appointment  Scott Le is a 69 y.o. male who presents today for new patient appointment.  He feels fairly well due to shingles per patient. He reports exercising includes walking and gym. He reports he is sleeping fairly well due to waking up because of pain from shingles.  Living in home with wife. Has two children and 6 grandchildren.  Previously working for [employer]. Currently retired but would like to go back to work.   Patient had a shingles outbreak the last week in December and he was seen by his dermatologist. He was treated with valtrex and has finished this last week. Continues to have intermittent sharp stabbing pain along the area of the healing rash.   [facility] [place] [facility] 23 within the past year Dr. Vira Agar - colonoscopy with [facility] in the past 4-5 years, reported normal.   No smoking, alcohol, or drugs.   -----------------------------------------------------------------   Review of Systems  Constitutional: Negative.   Respiratory: Negative.   Cardiovascular: Negative.   Neurological: Negative.     Social History      He  reports that he has never smoked. He has never used smokeless tobacco. He reports that he does not drink alcohol or use drugs.       Social History   Socioeconomic History  . Marital status: Married    Spouse name: Not on file  . Number of children: Not on file  . Years of education: Not on file  . Highest education level: Not on file  Occupational History  . Not on file  Tobacco Use  . Smoking status: Never Smoker  . Smokeless tobacco: Never Used  Substance and Sexual Activity  . Alcohol use: No    Alcohol/week: 0.0 standard drinks  . Drug use: No    . Sexual activity: Not on file  Other Topics Concern  . Not on file  Social History Narrative  . Not on file   Social Determinants of Health   Financial Resource Strain:   . Difficulty of Paying Living Expenses: Not on file  Food Insecurity:   . Worried About Charity fundraiser in the Last Year: Not on file  . Ran Out of Food in the Last Year: Not on file  Transportation Needs:   . Lack of Transportation (Medical): Not on file  . Lack of Transportation (Non-Medical): Not on file  Physical Activity:   . Days of Exercise per Week: Not on file  . Minutes of Exercise per Session: Not on file  Stress:   . Feeling of Stress : Not on file  Social Connections:   . Frequency of Communication with Friends and Family: Not on file  . Frequency of Social Gatherings with Friends and Family: Not on file  . Attends Religious Services: Not on file  . Active Member of Clubs or Organizations: Not on file  . Attends Archivist Meetings: Not on file  . Marital Status: Not on file    Past Medical History:  Diagnosis Date  . Arthritis   . Seasonal allergies      Patient Active Problem List   Diagnosis Date Noted  .  Digital mucous cyst 06/06/2018  . Encounter for general adult medical examination without abnormal findings 06/06/2018  . Immunization, tetanus toxoid 06/06/2018  . Pain in limb 06/06/2018  . Varicose veins of bilateral lower extremities with pain 01/17/2018  . Arthritis 01/17/2018  . Hyperlipidemia 01/17/2018  . Family history of cardiovascular disease 07/23/2015  . Benign localized hyperplasia of prostate with urinary obstruction 05/06/2014  . 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 ROTATOR CUFF REPAIR Right     Family History        Family Status  Relation Name Status  . Father  Deceased  . Mother  Deceased        His family history includes CAD  (age of onset: 22) in his father; CAD (age of onset: 92) in his mother; Valvular heart disease in his mother.      Allergies  Allergen Reactions  . Ciprofloxacin Other (See Comments)  . Codeine Nausea And Vomiting  . Levaquin [Levofloxacin] Other (See Comments)    Joint pain  . Nsaids Other (See Comments)    Bleeding gums/gum issues     Current Outpatient Medications:  Marland Kitchen  Multiple Vitamin (MULTIVITAMIN) tablet, Take 1 tablet by mouth daily., Disp: , Rfl:    Patient Care Team: Birdie Sons, MD as PCP - General (Family Medicine)    Objective:    Vitals: BP 122/72 (BP Location: Right Arm, Patient Position: Sitting, Cuff Size: Normal)   Temp (!) 97.1 F (36.2 C) (Temporal)   Ht 5\' 8"  (1.727 m)   Wt 167 lb 9.6 oz (76 kg)   BMI 25.48 kg/m    Vitals:   06/18/19 1359  BP: 122/72  Temp: (!) 97.1 F (36.2 C)  TempSrc: Temporal  Weight: 167 lb 9.6 oz (76 kg)  Height: 5\' 8"  (1.727 m)     Physical Exam Constitutional:      Appearance: Normal appearance.  HENT:     Head:   Cardiovascular:     Rate and Rhythm: Normal rate and regular rhythm.     Heart sounds: Normal heart sounds.  Pulmonary:     Effort: Pulmonary effort is normal.     Breath sounds: Normal breath sounds.  Skin:    General: Skin is warm and dry.     Findings: Rash present.  Neurological:     Mental Status: He is alert and oriented to person, place, and time. Mental status is at baseline.  Psychiatric:        Mood and Affect: Mood normal.        Behavior: Behavior normal.      Depression Screen No flowsheet data found.     Assessment & Plan:     Routine Health Maintenance and Physical Exam  Exercise Activities and Dietary recommendations Goals   None     Immunization History  Administered Date(s) Administered  . Pneumococcal Conjugate-13 03/06/2017  . Rabies, IM 03/09/2016, 03/12/2016, 03/16/2016, 03/23/2016  . Tdap 01/05/2012, 03/09/2016    Health Maintenance  Topic Date  Due  . Hepatitis C Screening  30-Oct-1950  . COLONOSCOPY  10/01/1999  . PNA vac Low Risk Adult (2 of 2 - PPSV23) 03/06/2018  . INFLUENZA VACCINE  01/06/2019  . TETANUS/TDAP  03/09/2026     Discussed health benefits of physical activity, and encouraged him to engage in regular exercise appropriate for his age and condition.    1.  Annual physical exam  [facility]   2. Postherpetic neuralgia  Discussed possible interventions including gabapentin. Patient would like to observe right now.  3. Moderate mixed hyperlipidemia not requiring statin therapy  Patient able to control this previously with dietary intervention. - Lipid Profile  4. Screening for deficiency anemia  - CBC with Differential  5. Screening for thyroid disorder  - TSH  6. Diabetes mellitus screening  - Comprehensive Metabolic Panel (CMET)  7. Encounter for hepatitis C screening test for low risk patient  - Hepatitis C Antibody  The entirety of the information documented in the History of Present Illness, Review of Systems and Physical Exam were personally obtained by me. Portions of this information were initially documented by [facility] and reviewed by me for thoroughness and accuracy.   F/u 1 year CPE --------------------------------------------------------------------    Trinna Post, PA-C  [facility]

## 2019-06-18 NOTE — Patient Instructions (Addendum)
Call insurance company about Shingrix vaccine - shingles    Postherpetic Neuralgia Postherpetic neuralgia (PHN) is nerve pain that occurs after a shingles infection. Shingles is a painful rash that appears on one area of the body, usually on the trunk or face. Shingles is caused by the varicella-zoster virus. This is the same virus that causes chickenpox. In people who have had chickenpox, the virus can resurface years later and cause shingles. You may have PHN if you continue to have pain for 4 months after your shingles rash has gone away. PHN appears in the same area where you had the shingles rash. The pain usually goes away after the rash disappears. Getting a vaccination for shingles can prevent PHN. This vaccine is recommended for people older than 60. It may prevent shingles, and may also lower your risk of PHN if you do get shingles. What are the causes? This condition is caused by damage to your nerves from the varicella-zoster virus. The damage makes your nerves overly sensitive. What increases the risk? The following factors may make you more likely to develop this condition:  Being older than 70 years of age.  Having severe pain before your shingles rash starts.  Having a severe rash.  Having shingles in and around the eye area.  Having a disease that makes your body unable to fight infections (weak immune system). What are the signs or symptoms? The main symptom of this condition is pain. The pain may:  Often be very bad and may be described as stabbing, burning, or feeling like an electric shock.  Come and go or may be there all the time.  Be triggered by light touches on the skin or changes in temperature. You may have itching along with the pain. How is this diagnosed? This condition may be diagnosed based on your symptoms and your history of shingles. Lab studies and other diagnostic tests are usually not needed. How is this treated? There is no cure for this  condition. Treatment for PHN will focus on pain relief. Over-the-counter pain relievers do not usually relieve PHN pain. You may need to work with a pain specialist. Treatment may include:  Antidepressant medicines to help with pain and improve sleep.  Anti-seizure medicines to relieve nerve pain.  Strong pain relievers (opioids).  A numbing patch worn on the skin (lidocaine patch).  Botox (botulinum toxin) injections to block pain signals between nerves and muscles.  Injections of numbing medicine or anti-inflammatory medicines around irritated nerves. Follow these instructions at home:   It may take a long time to recover from PHN. Work closely with your health care provider and develop a good support system at home.  Take over-the-counter and prescription medicines only as told by your health care provider.  Do not drive or use heavy machinery while taking prescription pain medicine.  Wear loose, comfortable clothing.  Cover sensitive areas with a dressing to reduce friction from clothing rubbing on the area.  If directed, put ice on the painful area: ? Put ice in a plastic bag. ? Place a towel between your skin and the bag. ? Leave the ice on for 20 minutes, 2-3 times a day.  Talk to your health care provider if you feel depressed or desperate. Living with long-term pain can be depressing.  Keep all follow-up visits as told by your health care provider. This is important. Contact a health care provider if:  Your medicine is not helping.  You are struggling to manage your pain  at home. Summary  Postherpetic neuralgia is a very painful disorder that can occur after an episode of shingles.  The pain is often severe, burning, electric, or stabbing.  Prescription medicines can be helpful in managing persistent pain.  Getting a vaccination for shingles can prevent PHN. This vaccine is recommended for people older than 60. This information is not intended to replace  advice given to you by your health care provider. Make sure you discuss any questions you have with your health care provider. Document Revised: 05/06/2017 Document Reviewed: 08/10/2016 Elsevier Patient Education  [place].

## 2019-06-21 DIAGNOSIS — Z1329 Encounter for screening for other suspected endocrine disorder: Secondary | ICD-10-CM | POA: Diagnosis not present

## 2019-06-21 DIAGNOSIS — Z1159 Encounter for screening for other viral diseases: Secondary | ICD-10-CM | POA: Diagnosis not present

## 2019-06-21 DIAGNOSIS — Z130 Encounter for screening for diseases of the blood and blood-forming organs and certain disorders involving the immune mechanism: Secondary | ICD-10-CM | POA: Diagnosis not present

## 2019-06-21 DIAGNOSIS — E782 Mixed hyperlipidemia: Secondary | ICD-10-CM | POA: Diagnosis not present

## 2019-06-21 DIAGNOSIS — Z131 Encounter for screening for diabetes mellitus: Secondary | ICD-10-CM | POA: Diagnosis not present

## 2019-06-22 ENCOUNTER — Telehealth: Payer: Self-pay

## 2019-06-22 LAB — CBC WITH DIFFERENTIAL/PLATELET
Basophils Absolute: 0 10*3/uL (ref 0.0–0.2)
Basos: 1 %
EOS (ABSOLUTE): 0.1 10*3/uL (ref 0.0–0.4)
Eos: 1 %
Hematocrit: 41.5 % (ref 37.5–51.0)
Hemoglobin: 13.8 g/dL (ref 13.0–17.7)
Immature Grans (Abs): 0 10*3/uL (ref 0.0–0.1)
Immature Granulocytes: 0 %
Lymphocytes Absolute: 2.1 10*3/uL (ref 0.7–3.1)
Lymphs: 41 %
MCH: 28.2 pg (ref 26.6–33.0)
MCHC: 33.3 g/dL (ref 31.5–35.7)
MCV: 85 fL (ref 79–97)
Monocytes Absolute: 0.5 10*3/uL (ref 0.1–0.9)
Monocytes: 9 %
Neutrophils Absolute: 2.5 10*3/uL (ref 1.4–7.0)
Neutrophils: 48 %
Platelets: 181 10*3/uL (ref 150–450)
RBC: 4.89 x10E6/uL (ref 4.14–5.80)
RDW: 14.5 % (ref 11.6–15.4)
WBC: 5.1 10*3/uL (ref 3.4–10.8)

## 2019-06-22 LAB — LIPID PANEL
Chol/HDL Ratio: 3.5 ratio (ref 0.0–5.0)
Cholesterol, Total: 170 mg/dL (ref 100–199)
HDL: 48 mg/dL (ref >39)
LDL Chol Calc (NIH): 107 mg/dL — ABNORMAL HIGH (ref 0–99)
Triglycerides: 80 mg/dL (ref 0–149)
VLDL Cholesterol Cal: 15 mg/dL (ref 5–40)

## 2019-06-22 LAB — COMPREHENSIVE METABOLIC PANEL
ALT: 17 IU/L (ref 0–44)
AST: 24 IU/L (ref 0–40)
Albumin/Globulin Ratio: 2 (ref 1.2–2.2)
Albumin: 4.5 g/dL (ref 3.8–4.8)
Alkaline Phosphatase: 81 IU/L (ref 39–117)
BUN/Creatinine Ratio: 22 (ref 10–24)
BUN: 26 mg/dL (ref 8–27)
Bilirubin Total: 0.5 mg/dL (ref 0.0–1.2)
CO2: 24 mmol/L (ref 20–29)
Calcium: 9.5 mg/dL (ref 8.6–10.2)
Chloride: 103 mmol/L (ref 96–106)
Creatinine, Ser: 1.16 mg/dL (ref 0.76–1.27)
GFR calc Af Amer: 74 mL/min/{1.73_m2} (ref >59)
GFR calc non Af Amer: 64 mL/min/{1.73_m2} (ref >59)
Globulin, Total: 2.3 g/dL (ref 1.5–4.5)
Glucose: 97 mg/dL (ref 65–99)
Potassium: 4.5 mmol/L (ref 3.5–5.2)
Sodium: 141 mmol/L (ref 134–144)
Total Protein: 6.8 g/dL (ref 6.0–8.5)

## 2019-06-22 LAB — HEPATITIS C ANTIBODY: Hep C Virus Ab: <0.1 s/co ratio (ref 0.0–0.9)

## 2019-06-22 LAB — TSH: TSH: 2.81 u[IU]/mL (ref 0.450–4.500)

## 2019-06-22 NOTE — Telephone Encounter (Signed)
-----   Message from Trinna Post, [place] sent at 06/22/2019  9:16 AM EST ----- All labs are normal except for cholesterol which is slightly high.   He has a 14.6% chance over the next ten years of having a heart attack or stroke. We recommend starting a cholesterol medication like Lipitor with risk >7.5% to reduce the chance of dying from heart attack or stroke. You would take this every night. If you would like to start this, let me know and I will send it in for you. We will follow up in 1-2 months with a visit and labs.   If patient agreeable to statin, please send in Lipitor 10 mg QHS #90 and schedule virtual or in person visit for 2 months.

## 2019-06-22 NOTE — Telephone Encounter (Signed)
Patient was advised and states that he will like to hold off until his appointment with Dr.Fisher on 08/03/2019 to have labs redrawn. Patient states that he has had this problem before and worked on his diet and his cholesterol lowered.FYI

## 2019-07-07 ENCOUNTER — Ambulatory Visit: Payer: Medicare HMO

## 2019-07-14 ENCOUNTER — Ambulatory Visit: Payer: Medicare HMO | Attending: Internal Medicine

## 2019-07-14 DIAGNOSIS — Z23 Encounter for immunization: Secondary | ICD-10-CM | POA: Insufficient documentation

## 2019-07-14 NOTE — Progress Notes (Signed)
   Covid-19 Vaccination Clinic  Name:  Scott Le    MRN: [id number] DOB: 10-19-1950  07/14/2019  [name] was observed post Covid-19 immunization for 15 minutes without incidence. He was provided with Vaccine Information Sheet and instruction to access the V-Safe system.   [name] was instructed to call 911 with any severe reactions post vaccine: Marland Kitchen Difficulty breathing  . Swelling of your face and throat  . A fast heartbeat  . A bad rash all over your body  . Dizziness and weakness    Immunizations Administered    Name Date Dose VIS Date Route   Pfizer COVID-19 Vaccine 07/14/2019  6:02 PM 0.3 mL 05/18/2019 Intramuscular   Manufacturer: [place]   Lot: [id number]   [place]: [id number]

## 2019-07-18 ENCOUNTER — Ambulatory Visit: Payer: Medicare HMO

## 2019-08-01 NOTE — Progress Notes (Signed)
Subjective:   Scott Le is a 69 y.o. male who presents for an Initial Medicare Annual Wellness Visit.    This visit is being conducted through telemedicine due to the COVID-19 pandemic. This patient has given me verbal consent via doximity to conduct this visit, patient states they are participating from their home address. Some vital signs may be absent or patient reported.    Patient identification: identified by name, DOB, and current address  Review of Systems  N/A  Cardiac Risk Factors include: advanced age (>90men, >58 women);male gender    Objective:    Today's Vitals   08/02/19 0809  PainSc: 0-No pain   There is no height or weight on file to calculate BMI. Unable to obtain vitals due to visit being conducted via telephonically.   Advanced Directives 08/02/2019 03/16/2016  Does Patient Have a Medical Advance Directive? Yes No  Type of Paramedic of [place];Living will -  Copy of [place] in Chart? No - copy requested -    Current Medications (verified) Outpatient Encounter Medications as of 08/02/2019  Medication Sig  . acetaminophen (TYLENOL) 500 MG tablet Take 500 mg by mouth every 6 (six) hours as needed.  . Multiple Vitamin (MULTIVITAMIN) tablet Take 1 tablet by mouth daily. Occasionally   No facility-administered encounter medications on file as of 08/02/2019.    Allergies (verified) Ciprofloxacin, Codeine, Levaquin [levofloxacin], and Nsaids   History: Past Medical History:  Diagnosis Date  . Arthritis   . Cataract    Corrected  . Seasonal allergies   . Shingles    Past Surgical History:  Procedure Laterality Date  . EYE SURGERY     Cataracts   . ROTATOR CUFF REPAIR Right   . VASECTOMY     Family History  Problem Relation Age of Onset  . CAD Father 68       s/p post first MI @ age 24, passed from [place] @ age 60  . CAD Mother 51       first MI @ age 62, s/p multiple stents  . Valvular heart  disease Mother        s/p TAVR   Social History   Socioeconomic History  . Marital status: Married    Spouse name: Not on file  . Number of children: 2  . Years of education: Not on file  . Highest education level: Associate degree: occupational, Hotel manager, or vocational program  Occupational History  . Occupation: retired  . Occupation: driver part time    Comment: not currently since Covid  Tobacco Use  . Smoking status: Never Smoker  . Smokeless tobacco: Never Used  Substance and Sexual Activity  . Alcohol use: No    Alcohol/week: 0.0 standard drinks  . Drug use: No  . Sexual activity: Not on file  Other Topics Concern  . Not on file  Social History Narrative  . Not on file   Social Determinants of Health   Financial Resource Strain: Low Risk   . Difficulty of Paying Living Expenses: Not hard at all  Food Insecurity: No Food Insecurity  . Worried About Charity fundraiser in the Last Year: Never true  . Ran Out of Food in the Last Year: Never true  Transportation Needs: No Transportation Needs  . Lack of Transportation (Medical): No  . Lack of Transportation (Non-Medical): No  Physical Activity: Inactive  . Days of Exercise per Week: 0 days  . Minutes of Exercise  per Session: 0 min  Stress: No Stress Concern Present  . Feeling of Stress : Not at all  Social Connections: Slightly Isolated  . Frequency of Communication with Friends and Family: More than three times a week  . Frequency of Social Gatherings with Friends and Family: More than three times a week  . Attends Religious Services: More than 4 times per year  . Active Member of Clubs or Organizations: No  . Attends Archivist Meetings: Never  . Marital Status: Married   Tobacco Counseling Counseling given: Not Answered   Clinical Intake:  Pre-visit preparation completed: Yes  Pain : No/denies pain Pain Score: 0-No pain     Nutritional Risks: None Diabetes: No  How often do you need  to have someone help you when you read instructions, pamphlets, or other written materials from your doctor or pharmacy?: 1 - Never  Interpreter Needed?: No  Information entered by :: [facility], LPN  Activities of Daily Living In your present state of health, do you have any difficulty performing the following activities: 08/02/2019 06/18/2019  Hearing? N N  Vision? N N  Difficulty concentrating or making decisions? N N  Walking or climbing stairs? N N  Dressing or bathing? N N  Doing errands, shopping? N N  Preparing Food and eating ? N -  Using the Toilet? N -  In the past six months, have you accidently leaked urine? N -  Do you have problems with loss of bowel control? N -  Managing your Medications? N -  Managing your Finances? N -  Housekeeping or managing your Housekeeping? N -  Some recent data might be hidden     Immunizations and Health Maintenance Immunization History  Administered Date(s) Administered  . Influenza-Unspecified 03/01/2019  . PFIZER SARS-COV-2 Vaccination 07/14/2019  . Pneumococcal Conjugate-13 03/06/2017  . Pneumococcal-Unspecified 02/06/2018  . Rabies, IM 03/09/2016, 03/12/2016, 03/16/2016, 03/23/2016  . Tdap 01/05/2012, 03/09/2016   There are no preventive care reminders to display for this patient.  Patient Care Team: Birdie Sons, MD as PCP - General (Family Medicine) Paulene Floor as Physician Assistant (Physician Assistant) Ralene Bathe, MD (Dermatology)  Indicate any recent Medical Services you may have received from other than [facility] providers in the past year (date may be approximate).    Assessment:   This is a routine wellness examination for Scott Le.  Hearing/Vision screen No exam data present  Dietary issues and exercise activities discussed: Current Exercise Habits: The patient does not participate in regular exercise at present, Exercise limited by: None identified  Goals    . Exercise 3x per week (30 min per  time)     Recommend to exercise for 3 days a week for at least 30 minutes at a time.       Depression Screen PHQ 2/9 Scores 06/18/2019  PHQ - 2 Score 0  PHQ- 9 Score 0    Fall Risk Fall Risk  08/02/2019 06/18/2019 06/06/2018  Falls in the past year? 0 0 0  Number falls in past yr: 0 0 -  Injury with Fall? 0 0 -    FALL RISK PREVENTION PERTAINING TO THE HOME:  Any stairs in or around the home? Yes  If so, are there any without handrails? No   Home free of loose throw rugs in walkways, pet beds, electrical cords, etc? Yes  Adequate lighting in your home to reduce risk of falls? Yes   ASSISTIVE DEVICES UTILIZED TO PREVENT FALLS:  Life alert? No  Use of a cane, walker or w/c? No  Grab bars in the bathroom? No  Shower chair or bench in shower? Yes  Elevated toilet seat or a handicapped toilet? No    TIMED UP AND GO:  Was the test performed? No .    Cognitive Function: Declined today.        Screening Tests Health Maintenance  Topic Date Due  . COLONOSCOPY  05/26/2022  . TETANUS/TDAP  03/09/2026  . INFLUENZA VACCINE  Completed  . Hepatitis C Screening  Completed  . PNA vac Low Risk Adult  Completed    Qualifies for Shingles Vaccine? Yes . Due for Shingrix. Pt has been advised to call insurance company to determine out of pocket expense. Advised may also receive vaccine at local pharmacy or Health Dept. Verbalized acceptance and understanding.  Tdap: Up to date  Flu Vaccine: Up to date  Pneumococcal Vaccine: Due for Pneumococcal vaccine. Does the patient want to receive this vaccine today?  No . Advised may receive this vaccine at local pharmacy or Health Dept. Aware to provide a copy of the vaccination record if obtained from local pharmacy or Health Dept. Verbalized acceptance and understanding.   Cancer Screenings:  Colorectal Screening: Completed 05/26/12. Repeat every 10 years.  Lung Cancer Screening: (Low Dose CT Chest recommended if Age 41-80 years, 30  pack-year currently smoking OR have quit w/in 15years.) does not qualify.   Additional Screening:  Hepatitis C Screening: Up to date  Vision Screening: Recommended annual ophthalmology exams for early detection of glaucoma and other disorders of the eye.  Dental Screening: Recommended annual dental exams for proper oral hygiene  Community Resource Referral:  CRR required this visit?  No        Plan:  I have personally reviewed and addressed the Medicare Annual Wellness questionnaire and have noted the following in the patient's chart:  A. Medical and social history B. Use of alcohol, tobacco or illicit drugs  C. Current medications and supplements D. Functional ability and status E.  Nutritional status F.  Physical activity G. Advance directives H. List of other physicians I.  Hospitalizations, surgeries, and ER visits in previous 12 months J.  [place] such as hearing and vision if needed, cognitive and depression L. Referrals and appointments   In addition, I have reviewed and discussed with patient certain preventive protocols, quality metrics, and best practice recommendations. A written personalized care plan for preventive services as well as general preventive health recommendations were provided to patient.   Glendora Score, [place]   [id number]  Nurse Health Advisor   Nurse Notes: None.

## 2019-08-02 ENCOUNTER — Other Ambulatory Visit: Payer: Self-pay

## 2019-08-02 ENCOUNTER — Ambulatory Visit (INDEPENDENT_AMBULATORY_CARE_PROVIDER_SITE_OTHER): Payer: Medicare HMO

## 2019-08-02 DIAGNOSIS — Z0000 Encounter for general adult medical examination without abnormal findings: Secondary | ICD-10-CM | POA: Diagnosis not present

## 2019-08-02 NOTE — Patient Instructions (Signed)
Scott Le , Thank you for taking time to come for your Medicare Wellness Visit. I appreciate your ongoing commitment to your health goals. Please review the following plan we discussed and let me know if I can assist you in the future.   Screening recommendations/referrals: Colonoscopy: Up to date, due 05/2022 Recommended yearly ophthalmology/optometry visit for glaucoma screening and checkup Recommended yearly dental visit for hygiene and checkup  Vaccinations: Influenza vaccine: Up to date Pneumococcal vaccine: Completed series Tdap vaccine: Up to date, due 03/2026 Shingles vaccine: Pt declines today.     Advanced directives: Please bring a copy of your POA (Power of Attorney) and/or Living Will to your next appointment.   Conditions/risks identified: Recommend to exercise 3 days a week for 30 minutes at a time.  Next appointment: 08/03/19 @ 10:00 AM with Dr Caryn Section   Preventive Care 70 Years and Older, Male Preventive care refers to lifestyle choices and visits with your health care provider that can promote health and wellness. What does preventive care include?  A yearly physical exam. This is also called an annual well check.  Dental exams once or twice a year.  Routine eye exams. Ask your health care provider how often you should have your eyes checked.  Personal lifestyle choices, including:  Daily care of your teeth and gums.  Regular physical activity.  Eating a healthy diet.  Avoiding tobacco and drug use.  Limiting alcohol use.  Practicing safe sex.  Taking low doses of aspirin every day.  Taking vitamin and mineral supplements as recommended by your health care provider. What happens during an annual well check? The services and screenings done by your health care provider during your annual well check will depend on your age, overall health, lifestyle risk factors, and family history of disease. Counseling  Your health care provider may ask you  questions about your:  Alcohol use.  Tobacco use.  Drug use.  Emotional well-being.  Home and relationship well-being.  Sexual activity.  Eating habits.  History of falls.  Memory and ability to understand (cognition).  Work and work Statistician. Screening  You may have the following tests or measurements:  Height, weight, and BMI.  Blood pressure.  Lipid and cholesterol levels. These may be checked every 5 years, or more frequently if you are over 45 years old.  Skin check.  Lung cancer screening. You may have this screening every year starting at age 55 if you have a 30-pack-year history of smoking and currently smoke or have quit within the past 15 years.  Fecal occult blood test (FOBT) of the stool. You may have this test every year starting at age 85.  Flexible sigmoidoscopy or colonoscopy. You may have a sigmoidoscopy every 5 years or a colonoscopy every 10 years starting at age 40.  Prostate cancer screening. Recommendations will vary depending on your family history and other risks.  Hepatitis C blood test.  Hepatitis B blood test.  Sexually transmitted disease (STD) testing.  Diabetes screening. This is done by checking your blood sugar (glucose) after you have not eaten for a while (fasting). You may have this done every 1-3 years.  Abdominal aortic aneurysm (AAA) screening. You may need this if you are a current or former smoker.  Osteoporosis. You may be screened starting at age 52 if you are at high risk. Talk with your health care provider about your test results, treatment options, and if necessary, the need for more tests. Vaccines  Your health care provider  may recommend certain vaccines, such as:  Influenza vaccine. This is recommended every year.  Tetanus, diphtheria, and acellular pertussis (Tdap, Td) vaccine. You may need a Td booster every 10 years.  Zoster vaccine. You may need this after age 79.  Pneumococcal 13-valent conjugate  (PCV13) vaccine. One dose is recommended after age 24.  Pneumococcal polysaccharide (PPSV23) vaccine. One dose is recommended after age 10. Talk to your health care provider about which screenings and vaccines you need and how often you need them. This information is not intended to replace advice given to you by your health care provider. Make sure you discuss any questions you have with your health care provider. Document Released: 06/20/2015 Document Revised: 02/11/2016 Document Reviewed: 03/25/2015 Elsevier Interactive Patient Education  2017 [employer] Prevention in the Home Falls can cause injuries. They can happen to people of all ages. There are many things you can do to make your home safe and to help prevent falls. What can I do on the outside of my home?  Regularly fix the edges of walkways and driveways and fix any cracks.  Remove anything that might make you trip as you walk through a door, such as a raised step or threshold.  Trim any bushes or trees on the path to your home.  Use bright outdoor lighting.  Clear any walking paths of anything that might make someone trip, such as rocks or tools.  Regularly check to see if handrails are loose or broken. Make sure that both sides of any steps have handrails.  Any raised decks and porches should have guardrails on the edges.  Have any leaves, snow, or ice cleared regularly.  Use sand or salt on walking paths during winter.  Clean up any spills in your garage right away. This includes oil or grease spills. What can I do in the bathroom?  Use night lights.  Install grab bars by the toilet and in the tub and shower. Do not use towel bars as grab bars.  Use non-skid mats or decals in the tub or shower.  If you need to sit down in the shower, use a plastic, non-slip stool.  Keep the floor dry. Clean up any water that spills on the floor as soon as it happens.  Remove soap buildup in the tub or shower  regularly.  Attach bath mats securely with double-sided non-slip rug tape.  Do not have throw rugs and other things on the floor that can make you trip. What can I do in the bedroom?  Use night lights.  Make sure that you have a light by your bed that is easy to reach.  Do not use any sheets or blankets that are too big for your bed. They should not hang down onto the floor.  Have a firm chair that has side arms. You can use this for support while you get dressed.  Do not have throw rugs and other things on the floor that can make you trip. What can I do in the kitchen?  Clean up any spills right away.  Avoid walking on wet floors.  Keep items that you use a lot in easy-to-reach places.  If you need to reach something above you, use a strong step stool that has a grab bar.  Keep electrical cords out of the way.  Do not use floor polish or wax that makes floors slippery. If you must use wax, use non-skid floor wax.  Do not have throw rugs  and other things on the floor that can make you trip. What can I do with my stairs?  Do not leave any items on the stairs.  Make sure that there are handrails on both sides of the stairs and use them. Fix handrails that are broken or loose. Make sure that handrails are as long as the stairways.  Check any carpeting to make sure that it is firmly attached to the stairs. Fix any carpet that is loose or worn.  Avoid having throw rugs at the top or bottom of the stairs. If you do have throw rugs, attach them to the floor with carpet tape.  Make sure that you have a light switch at the top of the stairs and the bottom of the stairs. If you do not have them, ask someone to add them for you. What else can I do to help prevent falls?  Wear shoes that:  Do not have high heels.  Have rubber bottoms.  Are comfortable and fit you well.  Are closed at the toe. Do not wear sandals.  If you use a stepladder:  Make sure that it is fully  opened. Do not climb a closed stepladder.  Make sure that both sides of the stepladder are locked into place.  Ask someone to hold it for you, if possible.  Clearly mark and make sure that you can see:  Any grab bars or handrails.  First and last steps.  Where the edge of each step is.  Use tools that help you move around (mobility aids) if they are needed. These include:  Canes.  Walkers.  Scooters.  Crutches.  Turn on the lights when you go into a dark area. Replace any light bulbs as soon as they burn out.  Set up your furniture so you have a clear path. Avoid moving your furniture around.  If any of your floors are uneven, fix them.  If there are any pets around you, be aware of where they are.  Review your medicines with your doctor. Some medicines can make you feel dizzy. This can increase your chance of falling. Ask your doctor what other things that you can do to help prevent falls. This information is not intended to replace advice given to you by your health care provider. Make sure you discuss any questions you have with your health care provider. Document Released: 03/20/2009 Document Revised: 10/30/2015 Document Reviewed: 06/28/2014 Elsevier Interactive Patient Education  2017 [employer].

## 2019-08-03 ENCOUNTER — Other Ambulatory Visit: Payer: Self-pay

## 2019-08-03 ENCOUNTER — Ambulatory Visit: Payer: Medicare HMO | Admitting: Family Medicine

## 2019-08-03 ENCOUNTER — Encounter: Payer: Self-pay | Admitting: Family Medicine

## 2019-08-03 VITALS — BP 134/76 | HR 52 | Temp 96.8°F | Wt 163.3 lb

## 2019-08-03 DIAGNOSIS — B0229 Other postherpetic nervous system involvement: Secondary | ICD-10-CM | POA: Diagnosis not present

## 2019-08-03 DIAGNOSIS — M5410 Radiculopathy, site unspecified: Secondary | ICD-10-CM

## 2019-08-03 MED ORDER — GABAPENTIN 300 MG PO CAPS: 300.0000 mg | ORAL_CAPSULE | Freq: Every day | ORAL | 1 refills | Status: DC

## 2019-08-03 MED ORDER — MELOXICAM 15 MG PO TABS: 15.0000 mg | ORAL_TABLET | Freq: Every day | ORAL | 1 refills | Status: DC

## 2019-08-03 NOTE — Progress Notes (Deleted)
Patient: Scott Le, Male    DOB: [birth year], 69 y.o.   MRN: [id number] Visit Date: 08/03/2019  Today's Provider: Lelon Huh, MD   No chief complaint on file.  Subjective:     Patient had a AWE with [facility] on 08/02/2019   Annual physical exam Scott Le is a 69 y.o. male who presents today for health maintenance and complete physical. He feels {DESC; WELL/FAIRLY WELL/POORLY:18703}. He reports exercising ***. He reports he is sleeping {DESC; WELL/FAIRLY WELL/POORLY:18703}.  -----------------------------------------------------------------   Review of Systems  Social History      He  reports that he has never smoked. He has never used smokeless tobacco. He reports that he does not drink alcohol or use drugs.       Social History   Socioeconomic History  . Marital status: Married    Spouse name: Not on file  . Number of children: 2  . Years of education: Not on file  . Highest education level: Associate degree: occupational, Hotel manager, or vocational program  Occupational History  . Occupation: retired  . Occupation: driver part time    Comment: not currently since Covid  Tobacco Use  . Smoking status: Never Smoker  . Smokeless tobacco: Never Used  Substance and Sexual Activity  . Alcohol use: No    Alcohol/week: 0.0 standard drinks  . Drug use: No  . Sexual activity: Not on file  Other Topics Concern  . Not on file  Social History Narrative  . Not on file   Social Determinants of Health   Financial Resource Strain: Low Risk   . Difficulty of Paying Living Expenses: Not hard at all  Food Insecurity: No Food Insecurity  . Worried About Charity fundraiser in the Last Year: Never true  . Ran Out of Food in the Last Year: Never true  Transportation Needs: No Transportation Needs  . Lack of Transportation (Medical): No  . Lack of Transportation (Non-Medical): No  Physical Activity: Inactive  . Days of Exercise per Week: 0 days  . Minutes  of Exercise per Session: 0 min  Stress: No Stress Concern Present  . Feeling of Stress : Not at all  Social Connections: Slightly Isolated  . Frequency of Communication with Friends and Family: More than three times a week  . Frequency of Social Gatherings with Friends and Family: More than three times a week  . Attends Religious Services: More than 4 times per year  . Active Member of Clubs or Organizations: No  . Attends Archivist Meetings: Never  . Marital Status: Married    Past Medical History:  Diagnosis Date  . Arthritis   . Cataract    Corrected  . Seasonal allergies   . Shingles      Patient Active Problem List   Diagnosis Date Noted  . Digital mucous cyst 06/06/2018  . Encounter for general adult medical examination without abnormal findings 06/06/2018  . Immunization, tetanus toxoid 06/06/2018  . Pain in limb 06/06/2018  . Varicose veins of bilateral lower extremities with pain 01/17/2018  . Arthritis 01/17/2018  . Hyperlipidemia 01/17/2018  . Family history of cardiovascular disease 07/23/2015  . Benign localized hyperplasia of prostate with urinary obstruction 05/06/2014  . 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 EYE SURGERY     Cataracts   . ROTATOR CUFF REPAIR Right   .  VASECTOMY      Family History        Family Status  Relation Name Status  . Father  Deceased  . Mother  Deceased        His family history includes CAD (age of onset: 35) in his father; CAD (age of onset: 52) in his mother; Valvular heart disease in his mother.      Allergies  Allergen Reactions  . Ciprofloxacin Other (See Comments)  . Codeine Nausea And Vomiting  . Levaquin [Levofloxacin] Other (See Comments)    Joint pain  . Nsaids Other (See Comments)    Bleeding gums/gum issues     Current Outpatient Medications:  .  acetaminophen (TYLENOL) 500 MG  tablet, Take 500 mg by mouth every 6 (six) hours as needed., Disp: , Rfl:  .  Multiple Vitamin (MULTIVITAMIN) tablet, Take 1 tablet by mouth daily. Occasionally, Disp: , Rfl:    Patient Care Team: Birdie Sons, MD as PCP - General (Family Medicine) Paulene Floor as Physician Assistant (Physician Assistant) Ralene Bathe, MD (Dermatology)    Objective:    Vitals: There were no vitals taken for this visit.  There were no vitals filed for this visit.   Physical Exam   Depression Screen PHQ 2/9 Scores 06/18/2019  PHQ - 2 Score 0  PHQ- 9 Score 0       Assessment & Plan:     Routine Health Maintenance and Physical Exam  Exercise Activities and Dietary recommendations Goals    . Exercise 3x per week (30 min per time)     Recommend to exercise for 3 days a week for at least 30 minutes at a time.        Immunization History  Administered Date(s) Administered  . Influenza-Unspecified 03/01/2019  . PFIZER SARS-COV-2 Vaccination 07/14/2019  . Pneumococcal Conjugate-13 03/06/2017  . Pneumococcal-Unspecified 02/06/2018  . Rabies, IM 03/09/2016, 03/12/2016, 03/16/2016, 03/23/2016  . Tdap 01/05/2012, 03/09/2016    Health Maintenance  Topic Date Due  . COLONOSCOPY  05/26/2022  . TETANUS/TDAP  03/09/2026  . INFLUENZA VACCINE  Completed  . Hepatitis C Screening  Completed  . PNA vac Low Risk Adult  Completed     Discussed health benefits of physical activity, and encouraged him to engage in regular exercise appropriate for his age and condition.    --------------------------------------------------------------------    Lelon Huh, MD  [place]

## 2019-08-03 NOTE — Progress Notes (Signed)
       Patient: Scott Le Male    DOB: 30-Nov-1950   69 y.o.   MRN: [id number] Visit Date: 08/03/2019  Today's Provider: Lelon Huh, MD   Chief Complaint  Patient presents with  . Herpes Zoster  . Hyperlipidemia   Subjective:     HPI  Follow up for Shingles & Nerve pain:   He reports he was diagnosed with shingles with rash on right side of face and temple in December treated with antivirals and rash cleared up within a few weeks. Is no longer having pain in side of face but developed pain running down th back and side of his left leg about a month ago associated with some back pain.   He had been followed for lumbar radiculopathy at [facility] for lumbar radiculopathy in November and December but reports that pain was on the other side. He was prescribed prednisone buy states he did not take it due to potential side effects.   ------------------------------------------------------------------------------------  Allergies  Allergen Reactions  . Ciprofloxacin Other (See Comments)  . Codeine Nausea And Vomiting  . Levaquin [Levofloxacin] Other (See Comments)    Joint pain  . Nsaids Other (See Comments)    Bleeding gums/gum issues     Current Outpatient Medications:  .  acetaminophen (TYLENOL) 500 MG tablet, Take 500 mg by mouth every 6 (six) hours as needed., Disp: , Rfl:  .  Multiple Vitamin (MULTIVITAMIN) tablet, Take 1 tablet by mouth daily. Occasionally, Disp: , Rfl:   Review of Systems  Constitutional: Negative.   Respiratory: Negative.   Cardiovascular: Negative.   Neurological:       Neuralgias    Social History   Tobacco Use  . Smoking status: Never Smoker  . Smokeless tobacco: Never Used  Substance Use Topics  . Alcohol use: No    Alcohol/week: 0.0 standard drinks      Objective:   BP 134/76 (BP Location: Right Arm, Patient Position: Sitting, Cuff Size: Normal)   Pulse (!) 52   Temp (!) 96.8 F (36 C) (Temporal)   Wt 163 lb 4.8 oz  (74.1 kg)   BMI 24.83 kg/m    Physical Exam   General appearance: Well developed, well nourished male, cooperative and in no acute distress Head: Normocephalic, without obvious abnormality, atraumatic Respiratory: Respirations even and unlabored, normal respiratory rate Extremities: Tender over para-lumbar spine reproducing pain reproducing pain in his left leg.       Assessment & Plan    1. Back pain with radiculopathy  - meloxicam (MOBIC) 15 MG tablet; Take 1 tablet (15 mg total) by mouth daily.  Dispense: 30 tablet; Refill: 1 - gabapentin (NEURONTIN) 300 MG capsule; Take 1 capsule (300 mg total) by mouth at bedtime.  Dispense: 30 capsule; Refill: 1   Counseled he may need another follow up with orthopedics or PT. Have sent for records from [facility]  2. Postherpetic neuralgia Fascial rash and pain have completely resolved with no sign of residual neuropathy.      Lelon Huh, MD  [facility]

## 2019-08-03 NOTE — Patient Instructions (Signed)
.   Please review the attached list of medications and notify my office if there are any errors.   . Please bring all of your medications to every appointment so we can make sure that our medication list is the same as yours.   

## 2019-08-08 ENCOUNTER — Ambulatory Visit: Payer: Medicare HMO | Attending: Internal Medicine

## 2019-08-08 DIAGNOSIS — Z23 Encounter for immunization: Secondary | ICD-10-CM

## 2019-08-08 NOTE — Progress Notes (Signed)
   Covid-19 Vaccination Clinic  Name:  Scott Le    MRN: [id number] DOB: [birth year]  08/08/2019  Scott Le was observed post Covid-19 immunization for 15 minutes without incident. He was provided with Vaccine Information Sheet and instruction to access the V-Safe system.   Scott Le was instructed to call 911 with any severe reactions post vaccine: Marland Kitchen Difficulty breathing  . Swelling of face and throat  . A fast heartbeat  . A bad rash all over body  . Dizziness and weakness   Immunizations Administered    Name Date Dose VIS Date Route   Pfizer COVID-19 Vaccine 08/08/2019  3:43 PM 0.3 mL 05/18/2019 Intramuscular   Manufacturer: [place]   Lot: [id number]   [place]: [id number]

## 2019-08-09 ENCOUNTER — Encounter: Payer: Self-pay | Admitting: Family Medicine

## 2019-08-09 DIAGNOSIS — M5136 Other intervertebral disc degeneration, lumbar region: Secondary | ICD-10-CM

## 2019-08-09 DIAGNOSIS — M1611 Unilateral primary osteoarthritis, right hip: Secondary | ICD-10-CM

## 2019-08-09 DIAGNOSIS — M169 Osteoarthritis of hip, unspecified: Secondary | ICD-10-CM | POA: Insufficient documentation

## 2019-09-11 ENCOUNTER — Telehealth: Payer: Self-pay | Admitting: Family Medicine

## 2019-09-11 DIAGNOSIS — M5410 Radiculopathy, site unspecified: Secondary | ICD-10-CM

## 2019-09-11 NOTE — Telephone Encounter (Signed)
Patient would like to know if Dr. Caryn Section would order CT scan on his back. He states this pain has been discussed previously.

## 2019-09-12 ENCOUNTER — Ambulatory Visit (INDEPENDENT_AMBULATORY_CARE_PROVIDER_SITE_OTHER): Payer: Medicare HMO | Admitting: Physician Assistant

## 2019-09-12 ENCOUNTER — Other Ambulatory Visit: Payer: Self-pay

## 2019-09-12 VITALS — BP 138/88 | HR 63 | Temp 96.9°F | Ht 68.0 in | Wt 161.0 lb

## 2019-09-12 DIAGNOSIS — M5416 Radiculopathy, lumbar region: Secondary | ICD-10-CM

## 2019-09-12 NOTE — Progress Notes (Signed)
Acute Office Visit  Subjective:    Patient ID: Scott Le, male    DOB: Sep 03, 1950, 69 y.o.   MRN: [id number]  Chief Complaint  Patient presents with  . Back Pain   Patient presents today for acute on chronic worsening of back pain. He has had back pain for several months now. It is present in his low back and radiates down both of his legs. Reports numbness or tingling in the back of his left thigh and travelling to the back of his left calf. He feels a similar sensation in his right leg as well. He was previously seen by [facility] for this previously and had a normal lumbar xray and physical therapy. He was last seen for this in 05/2019. He was followed up for backpain and post herpetic neuralgia with gabapentin and meloxicam. He reports meloxicam made him bleed too easily. He reports gabapentin did not work. Denies falling. Reports going up stairs worsens the pain. Walking seems to make pain better. No pain when he gets up in the morning but then pain will start suddenly and forcefully. He was recommended physical therapy which he did participate in. He reports this was not helpful.    Back Pain This is a recurrent problem. The current episode started 1 to 4 weeks ago. The problem occurs 2 to 4 times per day. The problem has been gradually worsening since onset. The pain is present in the sacro-iliac. The quality of the pain is described as burning. The pain radiates to the right thigh and left thigh. The pain is at a severity of 7/10. The pain is moderate. The pain is the same all the time. The symptoms are aggravated by coughing and sitting. Stiffness is present all day. Associated symptoms include leg pain.    Past Medical History:  Diagnosis Date  . Arthritis   . Cataract    Corrected  . Seasonal allergies   . Shingles     Past Surgical History:  Procedure Laterality Date  . EYE SURGERY     Cataracts   . ROTATOR CUFF REPAIR Right   . VASECTOMY      Family History    Problem Relation Age of Onset  . CAD Father 3       s/p post first MI @ age 60, passed from [place] @ age 64  . CAD Mother 15       first MI @ age 10, s/p multiple stents  . Valvular heart disease Mother        s/p TAVR    Social History   Socioeconomic History  . Marital status: Married    Spouse name: Not on file  . Number of children: 2  . Years of education: Not on file  . Highest education level: Associate degree: occupational, Hotel manager, or vocational program  Occupational History  . Occupation: retired  . Occupation: driver part time    Comment: not currently since Covid  Tobacco Use  . Smoking status: Never Smoker  . Smokeless tobacco: Never Used  Substance and Sexual Activity  . Alcohol use: No    Alcohol/week: 0.0 standard drinks  . Drug use: No  . Sexual activity: Not on file  Other Topics Concern  . Not on file  Social History Narrative  . Not on file   Social Determinants of Health   Financial Resource Strain: Low Risk   . Difficulty of Paying Living Expenses: Not hard at all  Food Insecurity: No Food  Insecurity  . Worried About Charity fundraiser in the Last Year: Never true  . Ran Out of Food in the Last Year: Never true  Transportation Needs: No Transportation Needs  . Lack of Transportation (Medical): No  . Lack of Transportation (Non-Medical): No  Physical Activity: Inactive  . Days of Exercise per Week: 0 days  . Minutes of Exercise per Session: 0 min  Stress: No Stress Concern Present  . Feeling of Stress : Not at all  Social Connections: Slightly Isolated  . Frequency of Communication with Friends and Family: More than three times a week  . Frequency of Social Gatherings with Friends and Family: More than three times a week  . Attends Religious Services: More than 4 times per year  . Active Member of Clubs or Organizations: No  . Attends Archivist Meetings: Never  . Marital Status: Married  Human resources officer Violence: Not At Risk   . Fear of Current or Ex-Partner: No  . Emotionally Abused: No  . Physically Abused: No  . Sexually Abused: No    Outpatient Medications Prior to Visit  Medication Sig Dispense Refill  . gabapentin (NEURONTIN) 300 MG capsule Take 1 capsule (300 mg total) by mouth at bedtime. 30 capsule 1  . Multiple Vitamin (MULTIVITAMIN) tablet Take 1 tablet by mouth daily. Occasionally    . acetaminophen (TYLENOL) 500 MG tablet Take 500 mg by mouth every 6 (six) hours as needed.    . meloxicam (MOBIC) 15 MG tablet Take 1 tablet (15 mg total) by mouth daily. (Patient not taking: Reported on 09/12/2019) 30 tablet 1   No facility-administered medications prior to visit.    Allergies  Allergen Reactions  . Ciprofloxacin Other (See Comments)  . Codeine Nausea And Vomiting  . Levaquin [Levofloxacin] Other (See Comments)    Joint pain  . Nsaids Other (See Comments)    Bleeding gums/gum issues    Review of Systems  Constitutional: Negative.   HENT: Negative.   Eyes: Negative.   Respiratory: Negative.   Cardiovascular: Negative.   Gastrointestinal: Positive for nausea.  Endocrine: Negative.   Genitourinary: Negative.   Musculoskeletal: Positive for back pain.  Skin: Negative.   Allergic/Immunologic: Negative.   Neurological: Negative.   Hematological: Negative.   Psychiatric/Behavioral: Negative.        Objective:    Physical Exam Constitutional:      Appearance: Normal appearance.  Musculoskeletal:     Lumbar back: No swelling, edema, deformity, signs of trauma, lacerations, spasms, tenderness or bony tenderness. Normal range of motion. Positive right straight leg raise test and positive left straight leg raise test. No scoliosis.  Neurological:     Mental Status: He is alert and oriented to person, place, and time. Mental status is at baseline.     Motor: Motor function is intact. No weakness.     Deep Tendon Reflexes:     Reflex Scores:      Patellar reflexes are 2+ on the right  side and 2+ on the left side. Psychiatric:        Mood and Affect: Mood normal.        Behavior: Behavior normal.     BP 138/88   Pulse 63   Temp (!) 96.9 F (36.1 C) (Temporal)   Ht 5\' 8"  (1.727 m)   Wt 161 lb (73 kg)   BMI 24.48 kg/m  Wt Readings from Last 3 Encounters:  09/12/19 161 lb (73 kg)  08/03/19 163 lb 4.8  oz (74.1 kg)  06/18/19 167 lb 9.6 oz (76 kg)    There are no preventive care reminders to display for this patient.  There are no preventive care reminders to display for this patient.   Lab Results  Component Value Date   TSH 2.810 06/21/2019   Lab Results  Component Value Date   WBC 5.1 06/21/2019   HGB 13.8 06/21/2019   HCT 41.5 06/21/2019   MCV 85 06/21/2019   PLT 181 06/21/2019   Lab Results  Component Value Date   NA 141 06/21/2019   K 4.5 06/21/2019   CO2 24 06/21/2019   GLUCOSE 97 06/21/2019   BUN 26 06/21/2019   CREATININE 1.16 06/21/2019   BILITOT 0.5 06/21/2019   ALKPHOS 81 06/21/2019   AST 24 06/21/2019   ALT 17 06/21/2019   PROT 6.8 06/21/2019   ALBUMIN 4.5 06/21/2019   CALCIUM 9.5 06/21/2019   ANIONGAP 4 (L) 07/29/2015   Lab Results  Component Value Date   CHOL 170 06/21/2019   Lab Results  Component Value Date   HDL 48 06/21/2019   Lab Results  Component Value Date   LDLCALC 107 (H) 06/21/2019   Lab Results  Component Value Date   TRIG 80 06/21/2019   Lab Results  Component Value Date   CHOLHDL 3.5 06/21/2019   Lab Results  Component Value Date   HGBA1C 4.9 07/29/2015       Assessment & Plan:  1. Lumbar radiculopathy  Suspect worsening lumbar radiculopathy. Have instructed patient to schedule with [facility] to further workup lumbar radiculopathy. He will likely need MRI and further intervention, which I have instructed him will best be performed by [facility]. Have gone over different medication options with him including increasing gabapentin, or switching to Lyrica or starting cymbalta. Patient  declines medication management at this time and agrees to schedule with [facility] for further evaluation.   The entirety of the information documented in the History of Present Illness, Review of Systems and Physical Exam were personally obtained by me. Portions of this information were initially documented by [facility], CMA and reviewed by me for thoroughness and accuracy.   I have spent 15 minutes with this patient, >50% of which was spent on counseling and coordination of care.        No orders of the defined types were placed in this encounter.    Trinna Post, PA-C

## 2019-09-12 NOTE — Patient Instructions (Signed)
Radicular Pain Radicular pain is a type of pain that spreads from your back or neck along a spinal nerve. Spinal nerves are nerves that leave the spinal cord and go to the muscles. Radicular pain is sometimes called radiculopathy, radiculitis, or a pinched nerve. When you have this type of pain, you may also have weakness, numbness, or tingling in the area of your body that is supplied by the nerve. The pain may feel sharp and burning. Depending on which spinal nerve is affected, the pain may occur in the:  Neck area (cervical radicular pain). You may also feel pain, numbness, weakness, or tingling in the arms.  Mid-spine area (thoracic radicular pain). You would feel this pain in the back and chest. This type is rare.  Lower back area (lumbar radicular pain). You would feel this pain as low back pain. You may feel pain, numbness, weakness, or tingling in the buttocks or legs. Sciatica is a type of lumbar radicular pain that shoots down the back of the leg. Radicular pain occurs when one of the spinal nerves becomes irritated or squeezed (compressed). It is often caused by something pushing on a spinal nerve, such as one of the bones of the spine (vertebrae) or one of the round cushions between vertebrae (intervertebral disks). This can result from:  An injury.  Wear and tear or aging of a disk.  The growth of a bone spur that pushes on the nerve. Radicular pain often goes away when you follow instructions from your health care provider for relieving pain at home. Follow these instructions at home: Managing pain      If directed, put ice on the affected area: ? Put ice in a plastic bag. ? Place a towel between your skin and the bag. ? Leave the ice on for 20 minutes, 2-3 times a day.  If directed, apply heat to the affected area as often as told by your health care provider. Use the heat source that your health care provider recommends, such as a moist heat pack or a heating pad. ? Place  a towel between your skin and the heat source. ? Leave the heat on for 20-30 minutes. ? Remove the heat if your skin turns bright red. This is especially important if you are unable to feel pain, heat, or cold. You may have a greater risk of getting burned. Activity   Do not sit or rest in bed for long periods of time.  Try to stay as active as possible. Ask your health care provider what type of exercise or activity is best for you.  Avoid activities that make your pain worse, such as bending and lifting.  Do not lift anything that is heavier than 10 lb (4.5 kg), or the limit that you are told, until your health care provider says that it is safe.  Practice using proper technique when lifting items. Proper lifting technique involves bending your knees and rising up.  Do strength and range-of-motion exercises only as told by your health care provider or physical therapist. General instructions  Take over-the-counter and prescription medicines only as told by your health care provider.  Pay attention to any changes in your symptoms.  Keep all follow-up visits as told by your health care provider. This is important. ? Your health care provider may send you to a physical therapist to help with this pain. Contact a health care provider if:  Your pain and other symptoms get worse.  Your pain medicine is not   helping.  Your pain has not improved after a few weeks of home care.  You have a fever. Get help right away if:  You have severe pain, weakness, or numbness.  You have difficulty with bladder or bowel control. Summary  Radicular pain is a type of pain that spreads from your back or neck along a spinal nerve.  When you have radicular pain, you may also have weakness, numbness, or tingling in the area of your body that is supplied by the nerve.  The pain may feel sharp or burning.  Radicular pain may be treated with ice, heat, medicines, or physical therapy. This  information is not intended to replace advice given to you by your health care provider. Make sure you discuss any questions you have with your health care provider. Document Revised: 12/06/2017 Document Reviewed: 12/06/2017 Elsevier Patient Education  2020 Elsevier Inc.  

## 2019-09-12 NOTE — Telephone Encounter (Signed)
Pt called back in to follow up on previous message. Pt feels that his back pain is getting worse. Assisted pt with an apt with [facility] for assistance.

## 2019-09-13 NOTE — Addendum Note (Signed)
Addended by: Lelon Huh E on: 09/13/2019 12:40 PM   Modules accepted: Orders

## 2019-09-14 NOTE — Telephone Encounter (Signed)
Patient calling back regarding this order. He is requesting call back from clinical staff.

## 2019-09-14 NOTE — Telephone Encounter (Signed)
Please review. KW 

## 2019-09-17 ENCOUNTER — Telehealth: Payer: Self-pay

## 2019-09-17 NOTE — Telephone Encounter (Signed)
MRI was ordered last week. What's his question?

## 2019-09-17 NOTE — Telephone Encounter (Signed)
Pt came in the office and b/c he went to [facility] they also ordered an MRI. Pt is requesting that Adriana's order for MRI be removed or canceled b/c [facility] advised it could possibly cause the insurance to deny coverage for the MRI. Pt stated that they wouldn't move forward on getting authorization from insurance until the order put in by Fabio Bering was canceled. Pt left note for Adriana with the information from [facility]. Note was placed in provider's box. Pt was advised that Fabio Bering is out of the office until this afternoon. Thanks TNP

## 2019-09-17 NOTE — Telephone Encounter (Signed)
MRI order cancelled

## 2019-09-17 NOTE — Telephone Encounter (Signed)
Copied from [place] [id number]. Topic: General - Call Back - No Documentation >> Sep 17, 2019  9:14 AM Erick Blinks wrote: Reason for CRM: Patient is calling for the 2nd time in regards to resovivng an issue with his MRI orders. Please advise

## 2019-09-17 NOTE — Telephone Encounter (Signed)
Patient notified

## 2019-09-24 ENCOUNTER — Telehealth: Payer: Self-pay | Admitting: Family Medicine

## 2019-09-24 NOTE — Telephone Encounter (Signed)
Judson Roch, do you know anything about this. MRI was originally ordered by me, but he saw orthopedist before it was scheduled. The orthopedist then ordered an MRI and I cancelled the one that I ordered. Now they are saying the orthopedist can't get it approved until I cancel my order, which I though was already done.

## 2019-09-24 NOTE — Telephone Encounter (Signed)
Patient is needing some assistance with MRI and with the insurance company - Patient will have to wait 60 day before having another MRI if this is not recitified. MRI was placed with [facility] & [facility]. And the referral with [facility] was canceled. [facility] coordinator name is Archie Patten is telling patient that the MRI was not canceled by Dr. Caryn Section.  Please advise CB- [phone]

## 2019-10-02 NOTE — Telephone Encounter (Signed)
When I called to cancel it had been denied

## 2019-11-26 ENCOUNTER — Telehealth: Payer: Self-pay

## 2019-11-26 NOTE — Telephone Encounter (Signed)
Copied from [place] [id number]. Topic: General - Inquiry >> Nov 26, 2019  4:35 PM Percell Belt A wrote: Reason for CRM: [facility] called and state pt needs MRI and would like to get the one with [facility].  She was calling if we could drop the on at [facility] because ins will not cover the one there without the one at [facility] ordered.   Best number [phone]/ Chrys Racer /  order NP is Elizebeth Koller

## 2019-11-27 NOTE — Telephone Encounter (Signed)
Chrys Racer with [facility] states patient needs a lumbar MRI. When they submitted request to patient's insurance. Insurance denied because they said they have a pending MRI from PCP. [facility] is requesting that we cancel the MRI that was ordered. Please advise.

## 2019-11-27 NOTE — Telephone Encounter (Signed)
I'm not sure what we are supposed to due about this. The MRI was already cancelled. Can you check with insurance and see what the problem is. Thanks.

## 2019-11-28 NOTE — Telephone Encounter (Signed)
Pt called back in to follow up on this concern. Pt says that he is still being told by insurance that update hasn't taken place. Pt states that he is not able to be treated due to error because insurance will not cover his Dx due to error. Explained to pt the message below, reassuring pt that provider is working on concern. Pt says that if this matter isn't handled he will contact his attorney.

## 2019-11-30 NOTE — Telephone Encounter (Signed)
[facility] has gotten approval and pt is scheduled for MRI per their office

## 2020-03-05 ENCOUNTER — Other Ambulatory Visit: Payer: Self-pay

## 2020-03-05 ENCOUNTER — Ambulatory Visit: Payer: Medicare HMO | Admitting: Dermatology

## 2020-03-05 ENCOUNTER — Encounter: Payer: Self-pay | Admitting: Dermatology

## 2020-03-05 DIAGNOSIS — B353 Tinea pedis: Secondary | ICD-10-CM | POA: Diagnosis not present

## 2020-03-05 DIAGNOSIS — Z1283 Encounter for screening for malignant neoplasm of skin: Secondary | ICD-10-CM | POA: Diagnosis not present

## 2020-03-05 DIAGNOSIS — L814 Other melanin hyperpigmentation: Secondary | ICD-10-CM

## 2020-03-05 DIAGNOSIS — L820 Inflamed seborrheic keratosis: Secondary | ICD-10-CM | POA: Diagnosis not present

## 2020-03-05 DIAGNOSIS — L821 Other seborrheic keratosis: Secondary | ICD-10-CM

## 2020-03-05 DIAGNOSIS — L738 Other specified follicular disorders: Secondary | ICD-10-CM

## 2020-03-05 DIAGNOSIS — D229 Melanocytic nevi, unspecified: Secondary | ICD-10-CM

## 2020-03-05 DIAGNOSIS — L720 Epidermal cyst: Secondary | ICD-10-CM

## 2020-03-05 DIAGNOSIS — D1800 Hemangioma unspecified site: Secondary | ICD-10-CM

## 2020-03-05 DIAGNOSIS — L578 Other skin changes due to chronic exposure to nonionizing radiation: Secondary | ICD-10-CM

## 2020-03-05 MED ORDER — KETOCONAZOLE 2 % EX CREA: TOPICAL_CREAM | CUTANEOUS | 0 refills | Status: DC

## 2020-03-05 NOTE — Progress Notes (Signed)
   Follow-Up Visit   Subjective  Scott Le is a 69 y.o. male who presents for the following: Annual Exam (no hx of skin CA - no new or changing moles, lesions, or spots that the patient has noticed). The patient presents for Total-Body Skin Exam (TBSE) for skin cancer screening and mole check.  The following portions of the chart were reviewed this encounter and updated as appropriate:  Tobacco  Allergies  Meds  Problems  Med Hx  Surg Hx  Fam Hx     Review of Systems:  No other skin or systemic complaints except as noted in HPI or Assessment and Plan.  Objective  Well appearing patient in no apparent distress; mood and affect are within normal limits.  A full examination was performed including scalp, head, eyes, ears, nose, lips, neck, chest, axillae, abdomen, back, buttocks, bilateral upper extremities, bilateral lower extremities, hands, feet, fingers, toes, fingernails, and toenails. All findings within normal limits unless otherwise noted below.  Objective  L chest x 1: Erythematous keratotic or waxy stuck-on papule or plaque.   Objective  Forehead: Enlarged oil gland.  Objective  Left Foot - Anterior: Clear today   Objective  R prox lat thigh: 0.7 cm firm SQ nodule   Assessment & Plan  Inflamed seborrheic keratosis L chest x 1  Destruction of lesion - L chest x 1 Complexity: simple   Destruction method: cryotherapy   Informed consent: discussed and consent obtained   Timeout:  patient name, date of birth, surgical site, and procedure verified Lesion destroyed using liquid nitrogen: Yes   Region frozen until ice ball extended beyond lesion: Yes   Outcome: patient tolerated procedure well with no complications   Post-procedure details: wound care instructions given    Sebaceous hyperplasia Forehead Benign, observe.   Tinea pedis of both feet Left Foot - Anterior Patient [place] and would like to start preventative treatment  since he will be in the rain a lot - Start Ketoconazole 2% cream QHS.   ketoconazole (NIZORAL) 2 % cream - Left Foot - Anterior  Epidermal inclusion cyst R prox lat thigh Benign, discussed surgical option.   Lentigines - Scattered tan macules - Discussed due to sun exposure - Benign, observe - Call for any changes  Seborrheic Keratoses - Stuck-on, waxy, tan-brown papules and plaques  - Discussed benign etiology and prognosis. - Observe - Call for any changes  Melanocytic Nevi - Tan-brown and/or pink-flesh-colored symmetric macules and papules - Benign appearing on exam today - Observation - Call clinic for new or changing moles - Recommend daily use of broad spectrum spf 30+ sunscreen to sun-exposed areas.   Hemangiomas - Red papules - Discussed benign nature - Observe - Call for any changes  Actinic Damage - diffuse scaly erythematous macules with underlying dyspigmentation - Recommend daily broad spectrum sunscreen SPF 30+ to sun-exposed areas, reapply every 2 hours as needed.  - Call for new or changing lesions.  Skin cancer screening performed today.  Return in about 1 year (around 03/05/2021) for TBSE.  Luther Redo, CMA, am acting as scribe for Sarina Ser, MD .  Documentation: I have reviewed the above documentation for accuracy and completeness, and I agree with the above.  Sarina Ser, MD

## 2020-03-05 NOTE — Patient Instructions (Signed)
Pre-Operative Instructions  You are scheduled for a surgical procedure at [facility]. We recommend you read the following instructions. If you have any questions or concerns, please call the office at [phone].  1. Shower and wash the entire body with soap and water the day of your surgery paying special attention to cleansing at and around the planned surgery site.  2. Avoid aspirin or aspirin containing products at least fourteen (14) days prior to your surgical procedure and for at least one week (7 Days) after your surgical procedure. If you take aspirin on a regular basis for heart disease or history of stroke or for any other reason, we may recommend you continue taking aspirin but please notify us if you take this on a regular basis. Aspirin can cause more bleeding to occur during surgery as well as prolonged bleeding and bruising after surgery.   3. Avoid other nonsteroidal pain medications at least one week prior to surgery and at least one week prior to your surgery. These include medications such as Ibuprofen (Motrin, Advil and Nuprin), Naprosyn, Voltaren, Relafen, etc. If medications are used for therapeutic reasons, please inform us as they can cause increased bleeding or prolonged bleeding during and bruising after surgical procedures.   4. Please advice us if you are taking any "blood thinner" medications such as Coumadin or Dipyridamole or Plavix or similar medications. These cause increased bleeding and prolonged bleeding during and bruising after surgical procedures. We may have to consider discontinuing these medications briefly prior to and shortly after your surgery, if safe to do so.   5. Please inform us of all medications you are currently taking. All medications that are taken regularly should be taken the day of surgery as you always do. Nevertheless, we need to be informed of what medications you are taking prior to surgery to whether they will affect the  procedure or cause any complications.   6. Please inform us of any medication allergies. Also inform us of whether you have allergies to Latex or rubber products or whether you have had any adverse reaction to Lidocaine or Epinephrine.  7. Please inform us of any prosthetic or artificial body parts such as artificial heart valve, joint replacements, etc., or similar condition that might require preoperative antibiotics.   8. We recommend avoidance of alcohol at least two weeks prior to surgery and continued avoidence for at least two weeks after surgery.   9. We recommend discontinuation of tobacco smoking at least two weeks prior to surgery and continued abstinence for at least two weeks after surgery.  10. Do not plan strenuous exercise, strenuous work or strenuous lifting for approximately four weeks after your surgery.   11. We request if you are unable to make your scheduled surgical appointment, please call us at least a week in advance or as soon as you are aware of a problem sot aht we can cancel or reschedule you.   12. You MAKE TAKE TYLENOL (acetaminophen) for pain as it is not a blood thinner.   13. PLEASE PLAN TO BE IN TOWN FOR TWO WEEKS FOLLOWING SURGERY, THIS IS IMPORTANT SO YOU CAN BE CHECKED FOR DRESSING CHANGES, SUTURE REMOVAL AND TO MONITOR FOR POSSIBLE COMPLICATIONS.   

## 2020-03-17 NOTE — Progress Notes (Signed)
     Established patient visit   Patient: Scott Le   DOB: 12-14-1950   69 y.o. Male  MRN: [id number] Visit Date: 03/18/2020  Today's healthcare provider: Trinna Post, PA-C   Chief Complaint  Patient presents with  . Referral   Subjective    HPI  Patient is here today requesting a referral to a urologist. He was previously seen by [facility] urology, but his provider is no longer there. His is requesting someone locally. Previously treated for BPH with lower urinary tract symptoms.      Medications: Outpatient Medications Prior to Visit  Medication Sig  . ketoconazole (NIZORAL) 2 % cream Apply to aa's QHS.  Marland Kitchen acetaminophen (TYLENOL) 500 MG tablet Take 500 mg by mouth every 6 (six) hours as needed. (Patient not taking: Reported on 03/05/2020)  . gabapentin (NEURONTIN) 300 MG capsule Take 1 capsule (300 mg total) by mouth at bedtime. (Patient not taking: Reported on 03/05/2020)  . meloxicam (MOBIC) 15 MG tablet Take 1 tablet (15 mg total) by mouth daily. (Patient not taking: Reported on 09/12/2019)  . Multiple Vitamin (MULTIVITAMIN) tablet Take 1 tablet by mouth daily. Occasionally (Patient not taking: Reported on 03/05/2020)   No facility-administered medications prior to visit.    Review of Systems  Constitutional: Negative.   Respiratory: Negative.   Cardiovascular: Negative.   Genitourinary: Negative.       Objective    BP 130/82   Pulse 66   Temp 98.5 F (36.9 C)   Resp 16   Ht 5\' 8"  (1.727 m)   Wt 160 lb 12.8 oz (72.9 kg)   BMI 24.45 kg/m    Physical Exam Constitutional:      Appearance: Normal appearance.  Cardiovascular:     Rate and Rhythm: Normal rate and regular rhythm.  Pulmonary:     Effort: Pulmonary effort is normal. No respiratory distress.  Skin:    General: Skin is warm and dry.  Neurological:     Mental Status: He is alert.  Psychiatric:        Mood and Affect: Mood normal.        Behavior: Behavior normal.       No results  found for any visits on 03/18/20.  Assessment & Plan    1. Benign localized hyperplasia of prostate with urinary obstruction  - Ambulatory referral to Urology   No follow-ups on file.      I spent 20 minutes dedicated to the care of this patient on the date of this encounter to include pre-visit review of records, face-to-face time with the patient discussing BPH, and post visit ordering of testing.  ITrinna Post, PA-C, have reviewed all documentation for this visit. The documentation on 03/19/20 for the exam, diagnosis, procedures, and orders are all accurate and complete.  The entirety of the information documented in the History of Present Illness, Review of Systems and Physical Exam were personally obtained by me. Portions of this information were initially documented by Wilburt Finlay, CMA and reviewed by me for thoroughness and accuracy.    Paulene Floor  [facility] [phone] (phone) [phone] (fax)  [place]

## 2020-03-18 ENCOUNTER — Encounter: Payer: Self-pay | Admitting: Physician Assistant

## 2020-03-18 ENCOUNTER — Other Ambulatory Visit: Payer: Self-pay

## 2020-03-18 ENCOUNTER — Ambulatory Visit: Payer: Medicare HMO | Admitting: Physician Assistant

## 2020-03-18 VITALS — BP 130/82 | HR 66 | Temp 98.5°F | Resp 16 | Ht 68.0 in | Wt 160.8 lb

## 2020-03-18 DIAGNOSIS — N401 Enlarged prostate with lower urinary tract symptoms: Secondary | ICD-10-CM | POA: Diagnosis not present

## 2020-03-18 DIAGNOSIS — N138 Other obstructive and reflux uropathy: Secondary | ICD-10-CM

## 2020-03-18 NOTE — Patient Instructions (Signed)

## 2020-03-31 NOTE — Progress Notes (Signed)
04/01/2020 10:04 AM   Scott Le 09/22/1950 [id number]  Referring provider: Birdie Sons, [address],  [place] [address] Chief Complaint  Patient presents with   Benign Prostatic Hypertrophy    HPI: Scott Le is a 69 y.o. male who presents today for evaluation and management of BPH with urinary obstruction.   Patient was previously followed by Dr. Jacqlyn Larsen at [facility].   Patient was last seen by Dr. Veronda Prude in 02/2019 for follow up of BPH with LUTS. He had a new onset of urinary frequency and nocturia x 3. He had a weak stream. Denied incomplete bladder emptying. No dysuria or hematuria. No fevers or chills. His post void residual was 74 cc. On exam his prostate was diminutive but firm on left.  Behavioral modifications and Urolift were discussed. Patient continued Flomax.   Patient has a history of spermatocele and grade 3 varicocele.   The patient has never had a prostate biopsy. Most recent PSA was 0.35.   Bladder scan by PVR is 132 mL today, but patient states he was nervous. He reports his urinary urgency and frequency. He urinates every 3 hours at night. He has mild urinary symptoms during the day. He denies a weak stream. The patient is drinking a lot of fluids. He reports Flomax increased his frequency therefore he discontinued the medication.   No family history of prostate cancer.   PSA trend: Component 03/02/19 02/26/16  PSA 0.35 0.21    IPSS    Row Name 04/01/20 1000         International Prostate Symptom Score   How often have you had the sensation of not emptying your bladder? Less than 1 in 5     How often have you had to urinate less than every two hours? Less than 1 in 5 times     How often have you found you stopped and started again several times when you urinated? Not at All     How often have you found it difficult to postpone urination? Not at All     How often have you had a weak urinary stream? Not at All      How often have you had to strain to start urination? Not at All     How many times did you typically get up at night to urinate? 3 Times     Total IPSS Score 5       Quality of Life due to urinary symptoms   If you were to spend the rest of your life with your urinary condition just the way it is now how would you feel about that? Mostly Satisfied            Score:  1-7 Mild 8-19 Moderate 20-35 Severe   PMH: Past Medical History:  Diagnosis Date   Arthritis    Cataract    Corrected   Seasonal allergies    Shingles     Surgical History: Past Surgical History:  Procedure Laterality Date   EYE SURGERY     Cataracts    ROTATOR CUFF REPAIR Right    VASECTOMY      Home Medications:  Allergies as of 04/01/2020      Reactions   Celecoxib    Other reaction(s): Dry Mouth   Ciprofloxacin Other (See Comments)   Codeine Nausea And Vomiting   Levaquin [levofloxacin] Other (See Comments)   Joint pain   Nsaids Other (See Comments)  Bleeding gums/gum issues      Medication List       Accurate as of April 01, 2020 11:59 PM. If you have any questions, ask your nurse or doctor.        acetaminophen 500 MG tablet Commonly known as: TYLENOL Take 500 mg by mouth every 6 (six) hours as needed.   DULoxetine 20 MG capsule Commonly known as: CYMBALTA duloxetine 20 mg capsule,delayed release  Take 1 capsule every day by oral route as directed.   gabapentin 300 MG capsule Commonly known as: NEURONTIN Take 1 capsule (300 mg total) by mouth at bedtime.   gabapentin 100 MG capsule Commonly known as: NEURONTIN   ketoconazole 2 % cream Commonly known as: NIZORAL Apply to aa's QHS.   meloxicam 15 MG tablet Commonly known as: MOBIC Take 1 tablet (15 mg total) by mouth daily.   multivitamin tablet Take 1 tablet by mouth daily. Occasionally       Allergies:  Allergies  Allergen Reactions   Celecoxib     Other reaction(s): Dry Mouth   Ciprofloxacin  Other (See Comments)   Codeine Nausea And Vomiting   Levaquin [Levofloxacin] Other (See Comments)    Joint pain   Nsaids Other (See Comments)    Bleeding gums/gum issues    Family History: Family History  Problem Relation Age of Onset   CAD Father 60       s/p post first MI @ age 37, passed from MI @ age 37   CAD Mother 73       first MI @ age 28, s/p multiple stents   Valvular heart disease Mother        s/p TAVR    Social History:  reports that he has never smoked. He has never used smokeless tobacco. He reports that he does not drink alcohol and does not use drugs.   Physical Exam: BP 124/80    Pulse 61   Constitutional:  Alert and oriented, No acute distress. HEENT: [place] AT, moist mucus membranes.  Trachea midline, no masses. Cardiovascular: No clubbing, cyanosis, or edema. Respiratory: Normal respiratory effort, no increased work of breathing. GI: Abdomen is soft, nontender, nondistended, no abdominal masses GU: No CVA tenderness. 50+ g prostate, smooth no nodules/tenderness. Prostate asymmetric L>R.  Skin: No rashes, bruises or suspicious lesions. Neurologic: Grossly intact, no focal deficits, moving all 4 extremities. Psychiatric: Normal mood and affect.  Laboratory Data:  Lab Results  Component Value Date   CREATININE 1.16 06/21/2019    Urinalysis Results for orders placed or performed in visit on 04/01/20  Microscopic Examination   Urine  Result Value Ref Range   WBC, UA 0-5 0 - 5 /hpf   RBC None seen 0 - 2 /hpf   Epithelial Cells (non renal) 0-10 0 - 10 /hpf   Bacteria, UA None seen None seen/Few  Urinalysis, Complete  Result Value Ref Range   Specific Gravity, UA <1.005 (L) 1.005 - 1.030   pH, UA 5.5 5.0 - 7.5   Color, UA Yellow Yellow   Appearance Ur Clear Clear   Leukocytes,UA Negative Negative   Protein,UA Negative Negative/Trace   Glucose, UA Negative Negative   Ketones, UA Negative Negative   RBC, UA Negative Negative   Bilirubin, UA  Negative Negative   Urobilinogen, Ur 0.2 0.2 - 1.0 mg/dL   Nitrite, UA Negative Negative   Microscopic Examination See below:      Assessment & Plan:    1. BPH with nocturia  IPSS score: 5, mild. Patient is not interested in surgical intervention at this time. Discussed behavioral modification.  DRE stable RTC in 1 year.   2. Incomplete bladder emptying  Bladder scan by PVR was borderline mildly elevated at 132 mL, pt was nervous.  Will follow  3. PSA screening/abnormal DRE On exam prostate was asymmetric L>R. This has been documented by previous urologist and I will continue to monitor closely.  PSA was stable at 0.35 on 03/02/2019 Will continue PSA annually.   4. Varicocele This is a minimal bother and patient is asymptomatic.   F/u annually  [facility] [address], [place], [place] [address] [phone]  I, Selena Batten, am acting as a scribe for Dr. Hollice Espy.  I have reviewed the above documentation for accuracy and completeness, and I agree with the above.   Hollice Espy, MD  I spent 45 total minutes on the day of the encounter including pre-visit review of the medical record, face-to-face time with the patient, and post visit ordering of labs/imaging/tests.

## 2020-04-01 ENCOUNTER — Other Ambulatory Visit: Payer: Self-pay

## 2020-04-01 ENCOUNTER — Ambulatory Visit: Payer: Medicare HMO | Admitting: Urology

## 2020-04-01 VITALS — BP 124/80 | HR 61

## 2020-04-01 DIAGNOSIS — N400 Enlarged prostate without lower urinary tract symptoms: Secondary | ICD-10-CM

## 2020-04-01 LAB — URINALYSIS, COMPLETE
Bilirubin, UA: NEGATIVE
Glucose, UA: NEGATIVE
Ketones, UA: NEGATIVE
Leukocytes,UA: NEGATIVE
Nitrite, UA: NEGATIVE
Protein,UA: NEGATIVE
RBC, UA: NEGATIVE
Specific Gravity, UA: <1.005 — ABNORMAL LOW (ref 1.005–1.030)
Urobilinogen, Ur: 0.2 mg/dL (ref 0.2–1.0)
pH, UA: 5.5 (ref 5.0–7.5)

## 2020-04-01 LAB — MICROSCOPIC EXAMINATION
Bacteria, UA: NONE SEEN
RBC, Urine: NONE SEEN /hpf (ref 0–2)

## 2020-05-06 ENCOUNTER — Other Ambulatory Visit: Payer: Self-pay

## 2020-05-06 ENCOUNTER — Telehealth: Payer: Self-pay

## 2020-05-06 ENCOUNTER — Ambulatory Visit (INDEPENDENT_AMBULATORY_CARE_PROVIDER_SITE_OTHER): Payer: Medicare HMO | Admitting: Dermatology

## 2020-05-06 DIAGNOSIS — L720 Epidermal cyst: Secondary | ICD-10-CM

## 2020-05-06 MED ORDER — MUPIROCIN 2 % EX OINT: 1.0000 "application " | TOPICAL_OINTMENT | Freq: Every day | CUTANEOUS | 0 refills | Status: DC

## 2020-05-06 NOTE — Progress Notes (Signed)
   Follow-Up Visit   Subjective  Scott Le is a 69 y.o. male who presents for the following: Cyst (R proximal lat thigh, pt presents for excision).  The following portions of the chart were reviewed this encounter and updated as appropriate:  Tobacco  Allergies  Meds  Problems  Med Hx  Surg Hx  Fam Hx     Review of Systems:  No other skin or systemic complaints except as noted in HPI or Assessment and Plan.  Objective  Well appearing patient in no apparent distress; mood and affect are within normal limits.  A focused examination was performed including Right thigh. Relevant physical exam findings are noted in the Assessment and Plan.  Objective  Right proximal lat thigh: Cystic pap 1.2cm   Assessment & Plan  Epidermal cyst -symptomatic and growing patient would like removed. Right proximal lat thigh  Cyst vs other  Start Mupirocin oint qd with dressing changes  mupirocin ointment (BACTROBAN) 2 % - Right proximal lat thigh  Skin excision - Right proximal lat thigh  Lesion length (cm):  1.2 Lesion width (cm):  1.2 Margin per side (cm):  0.1 Total excision diameter (cm):  1.4 Informed consent: discussed and consent obtained   Timeout: patient name, date of birth, surgical site, and procedure verified   Procedure prep:  Patient was prepped and draped in usual sterile fashion Prep type:  Isopropyl alcohol and povidone-iodine Anesthesia: the lesion was anesthetized in a standard fashion   Anesthetic:  1% lidocaine w/ epinephrine 1-100,000 buffered w/ 8.4% NaHCO3 (6.0cc) Instrument used: #15 blade   Hemostasis achieved with: pressure   Hemostasis achieved with comment:  Electrocautery Outcome: patient tolerated procedure well with no complications   Dressing: Mupirocin.    Skin repair - Right proximal lat thigh Complexity:  Complex Final length (cm):  2.7 Reason for type of repair: reduce tension to allow closure, reduce the risk of dehiscence, infection,  and necrosis, reduce subcutaneous dead space and avoid a hematoma, allow closure of the large defect, preserve normal anatomy, preserve normal anatomical and functional relationships and enhance both functionality and cosmetic results   Undermining: area extensively undermined   Undermining comment:  Undermining Defect 1.4cm Subcutaneous layers (deep stitches):  Suture size:  3-0 Suture type: Vicryl (polyglactin 910)   Subcutaneous suture technique: Inverted Dermal. Fine/surface layer approximation (top stitches):  Suture size:  4-0 Suture type: nylon   Stitches: simple running   Suture removal (days):  7 Hemostasis achieved with: pressure Outcome: patient tolerated procedure well with no complications   Post-procedure details: sterile dressing applied and wound care instructions given   Dressing type: bandage, pressure dressing and bacitracin (Mupirocin)    Specimen 1 - Surgical pathology Differential Diagnosis: D48.5 Cyst vs other Check Margins: No Cystic pap 1.2cm  Return in about 1 week (around 05/13/2020) for suture removal.   I, Othelia Pulling, RMA, am acting as scribe for Sarina Ser, MD .   Documentation: I have reviewed the above documentation for accuracy and completeness, and I agree with the above.  Sarina Ser, MD

## 2020-05-06 NOTE — Telephone Encounter (Signed)
Pt doing fine after today's surgery.  Advised to call the office if any problems./sh

## 2020-05-06 NOTE — Patient Instructions (Signed)

## 2020-05-08 ENCOUNTER — Encounter: Payer: Self-pay | Admitting: Dermatology

## 2020-05-13 ENCOUNTER — Other Ambulatory Visit: Payer: Self-pay

## 2020-05-13 ENCOUNTER — Ambulatory Visit (INDEPENDENT_AMBULATORY_CARE_PROVIDER_SITE_OTHER): Payer: Medicare HMO | Admitting: Dermatology

## 2020-05-13 DIAGNOSIS — L720 Epidermal cyst: Secondary | ICD-10-CM

## 2020-05-13 NOTE — Progress Notes (Signed)
   Follow-Up Visit   Subjective  Scott Le is a 69 y.o. male who presents for the following: cyst bx proven (R proximal lat thigh, pt presents for suture removal).  The following portions of the chart were reviewed this encounter and updated as appropriate:   Tobacco  Allergies  Meds  Problems  Med Hx  Surg Hx  Fam Hx     Review of Systems:  No other skin or systemic complaints except as noted in HPI or Assessment and Plan.  Objective  Well appearing patient in no apparent distress; mood and affect are within normal limits.  A focused examination was performed including R leg. Relevant physical exam findings are noted in the Assessment and Plan.  Objective  Right proximal lat thigh: Healing excision site   Assessment & Plan  Epidermal cyst Right proximal lat thigh  Bx proven Healing excision site.  Wound cleansed, sutures removed, wound cleansed and steri strips applied. Discussed pathology results.   Other Related Medications mupirocin ointment (BACTROBAN) 2 %  Return for As scheduled for TBSE.   I, Othelia Pulling, RMA, am acting as scribe for Sarina Ser, MD .  Documentation: I have reviewed the above documentation for accuracy and completeness, and I agree with the above.  Sarina Ser, MD

## 2020-05-19 ENCOUNTER — Encounter: Payer: Self-pay | Admitting: Dermatology

## 2020-08-04 NOTE — Progress Notes (Signed)
Subjective:   Scott Le is a 70 y.o. male who presents for Medicare Annual/Subsequent preventive examination.  I connected with Scott Le today by telephone and verified that I am speaking with the correct person using two identifiers. Location patient: home Location provider: work Persons participating in the virtual visit: patient, provider.   I discussed the limitations, risks, security and privacy concerns of performing an evaluation and management service by telephone and the availability of in person appointments. I also discussed with the patient that there may be a patient responsible charge related to this service. The patient expressed understanding and verbally consented to this telephonic visit.    Interactive audio and video telecommunications were attempted between this provider and patient, however failed, due to patient having technical difficulties OR patient did not have access to video capability.  We continued and completed visit with audio only.   Review of Systems    N/A  Cardiac Risk Factors include: advanced age (>73men, >50 women);male gender     Objective:    There were no vitals filed for this visit. There is no height or weight on file to calculate BMI.  Advanced Directives 08/05/2020 08/02/2019 03/16/2016  Does Patient Have a Medical Advance Directive? Yes Yes No  Type of Paramedic of [place];Living will [place];Living will -  Copy of [place] in Chart? No - copy requested No - copy requested -    Current Medications (verified) Outpatient Encounter Medications as of 08/05/2020  Medication Sig  . acetaminophen (TYLENOL) 500 MG tablet Take 500 mg by mouth every 6 (six) hours as needed.   . Multiple Vitamin (MULTIVITAMIN) tablet Take 1 tablet by mouth daily. Occasionally  . ketoconazole (NIZORAL) 2 % cream Apply to aa's QHS. (Patient not taking: No sig reported)  . mupirocin  ointment (BACTROBAN) 2 % Apply 1 application topically daily. Qd to excision site with dressing changes (Patient not taking: Reported on 08/05/2020)   No facility-administered encounter medications on file as of 08/05/2020.    Allergies (verified) Celecoxib, Ciprofloxacin, Codeine, Levaquin [levofloxacin], and Nsaids   History: Past Medical History:  Diagnosis Date  . Arthritis   . Cataract    Corrected  . Seasonal allergies   . Shingles   . Soft tissue injury    Past Surgical History:  Procedure Laterality Date  . EYE SURGERY     Cataracts   . ROTATOR CUFF REPAIR Right   . VASECTOMY     Family History  Problem Relation Age of Onset  . CAD Father 30       s/p post first MI @ age 23, passed from [place] @ age 67  . CAD Mother 45       first MI @ age 53, s/p multiple stents  . Valvular heart disease Mother        s/p TAVR   Social History   Socioeconomic History  . Marital status: Married    Spouse name: Not on file  . Number of children: 2  . Years of education: Not on file  . Highest education level: Associate degree: occupational, Hotel manager, or vocational program  Occupational History  . Occupation: retired  Tobacco Use  . Smoking status: Never Smoker  . Smokeless tobacco: Never Used  . Tobacco comment: tried at 47  Vaping Use  . Vaping Use: Never used  Substance and Sexual Activity  . Alcohol use: No    Alcohol/week: 0.0 standard drinks  .  Drug use: No  . Sexual activity: Not on file  Other Topics Concern  . Not on file  Social History Narrative  . Not on file   Social Determinants of Health   Financial Resource Strain: Low Risk   . Difficulty of Paying Living Expenses: Not hard at all  Food Insecurity: No Food Insecurity  . Worried About Charity fundraiser in the Last Year: Never true  . Ran Out of Food in the Last Year: Never true  Transportation Needs: No Transportation Needs  . Lack of Transportation (Medical): No  . Lack of Transportation  (Non-Medical): No  Physical Activity: Insufficiently Active  . Days of Exercise per Week: 3 days  . Minutes of Exercise per Session: 40 min  Stress: No Stress Concern Present  . Feeling of Stress : Not at all  Social Connections: Moderately Integrated  . Frequency of Communication with Friends and Family: Twice a week  . Frequency of Social Gatherings with Friends and Family: More than three times a week  . Attends Religious Services: More than 4 times per year  . Active Member of Clubs or Organizations: No  . Attends Archivist Meetings: Never  . Marital Status: Married    Tobacco Counseling Counseling given: Not Answered Comment: tried at 64   Clinical Intake:  Pre-visit preparation completed: Yes  Pain : No/denies pain     Nutritional Risks: None Diabetes: No  How often do you need to have someone help you when you read instructions, pamphlets, or other written materials from your doctor or pharmacy?: 1 - Never  Diabetic? No  Interpreter Needed?: No  Information entered by :: [facility], LPN   Activities of Daily Living In your present state of health, do you have any difficulty performing the following activities: 08/05/2020  Hearing? N  Vision? N  Difficulty concentrating or making decisions? N  Walking or climbing stairs? N  Dressing or bathing? N  Doing errands, shopping? N  Preparing Food and eating ? N  Using the Toilet? N  In the past six months, have you accidently leaked urine? N  Do you have problems with loss of bowel control? N  Managing your Medications? N  Managing your Finances? N  Housekeeping or managing your Housekeeping? N  Some recent data might be hidden    Patient Care Team: Scott Sons, MD as PCP - General (Family Medicine) Scott Le as Physician Assistant (Physician Assistant) Scott Bathe, MD (Dermatology) Scott Robson, MD as Referring Physician (Ophthalmology)  Indicate any recent Medical  Services you may have received from other than [facility] providers in the past year (date may be approximate).     Assessment:   This is a routine wellness examination for Scott Le.  Hearing/Vision screen No exam data present  Dietary issues and exercise activities discussed: Current Exercise Habits: Structured exercise class, Type of exercise: stretching;walking;Other - see comments (stair climbing, bike), Time (Minutes): 45, Frequency (Times/Week): 3, Weekly Exercise (Minutes/Week): 135, Intensity: Moderate, Exercise limited by: None identified  Goals   None    Depression Screen PHQ 2/9 Scores 08/05/2020 06/18/2019  PHQ - 2 Score 0 0  PHQ- 9 Score - 0    Fall Risk Fall Risk  08/05/2020 08/02/2019 06/18/2019 06/06/2018  Falls in the past year? 0 0 0 0  Number falls in past yr: 0 0 0 -  Injury with Fall? 0 0 0 -    FALL RISK PREVENTION PERTAINING TO THE HOME:  Any stairs in or around the home? Yes  If so, are there any without handrails? No  Home free of loose throw rugs in walkways, pet beds, electrical cords, etc? Yes  Adequate lighting in your home to reduce risk of falls? Yes   ASSISTIVE DEVICES UTILIZED TO PREVENT FALLS:  Life alert? No  Use of a cane, walker or w/c? No  Grab bars in the bathroom? No  Shower chair or bench in shower? Yes  Elevated toilet seat or a handicapped toilet? No    Cognitive Function: Normal cognitive status assessed by observation by this Nurse Health Advisor. No abnormalities found.         Immunizations Immunization History  Administered Date(s) Administered  . Influenza-Unspecified 03/01/2019  . PFIZER(Purple Top)SARS-COV-2 Vaccination 07/14/2019, 08/08/2019  . Pneumococcal Conjugate-13 03/06/2017  . Pneumococcal-Unspecified 02/06/2018  . Rabies, IM 03/09/2016, 03/12/2016, 03/16/2016, 03/23/2016  . Tdap 01/05/2012, 03/09/2016  . Zoster Recombinat (Shingrix) 02/17/2020    TDAP status: Up to date  Flu Vaccine status: Up to  date  Pneumococcal vaccine status: Up to date  Covid-19 vaccine status: Completed vaccines  Qualifies for Shingles Vaccine? Yes   Zostavax completed No   Shingrix Completed?: Yes  Screening Tests Health Maintenance  Topic Date Due  . COVID-19 Vaccine (3 - Pfizer risk 4-dose series) 09/05/2019  . INFLUENZA VACCINE  09/04/2020 (Originally 01/06/2020)  . COLONOSCOPY (Pts 45-71yrs Insurance coverage will need to be confirmed)  05/26/2022  . TETANUS/TDAP  03/09/2026  . Hepatitis C Screening  Completed  . PNA vac Low Risk Adult  Completed  . HPV VACCINES  Aged Out    Health Maintenance  Health Maintenance Due  Topic Date Due  . COVID-19 Vaccine (3 - Pfizer risk 4-dose series) 09/05/2019    Colorectal cancer screening: Type of screening: Colonoscopy. Completed 05/26/12. Repeat every 10 years  Lung Cancer Screening: (Low Dose CT Chest recommended if Age 71-80 years, 30 pack-year currently smoking OR have quit w/in 15years.) does not qualify.   Additional Screening:  Hepatitis C Screening: Up to date  Vision Screening: Recommended annual ophthalmology exams for early detection of glaucoma and other disorders of the eye. Is the patient up to date with their annual eye exam?  Yes  Who is the provider or what is the name of the office in which the patient attends annual eye exams? Dr George Ina @ [place] If pt is not established with a provider, would they like to be referred to a provider to establish care? No .   Dental Screening: Recommended annual dental exams for proper oral hygiene  Community Resource Referral / Chronic Care Management: CRR required this visit?  No   CCM required this visit?  No      Plan:     I have personally reviewed and noted the following in the patient's chart:   . Medical and social history . Use of alcohol, tobacco or illicit drugs  . Current medications and supplements . Functional ability and status . Nutritional status . Physical  activity . Advanced directives . List of other physicians . Hospitalizations, surgeries, and ER visits in previous 12 months . Vitals . Screenings to include cognitive, depression, and falls . Referrals and appointments  In addition, I have reviewed and discussed with patient certain preventive protocols, quality metrics, and best practice recommendations. A written personalized care plan for preventive services as well as general preventive health recommendations were provided to patient.     Ishita Mcnerney [place], [place]   02/05/4781  Nurse Notes: Requested records from pharmacy to up date flu vaccine, Covid booster and Shingrix vaccines.

## 2020-08-05 ENCOUNTER — Other Ambulatory Visit: Payer: Self-pay

## 2020-08-05 ENCOUNTER — Ambulatory Visit: Payer: Self-pay

## 2020-08-05 ENCOUNTER — Ambulatory Visit (INDEPENDENT_AMBULATORY_CARE_PROVIDER_SITE_OTHER): Payer: Medicare HMO

## 2020-08-05 DIAGNOSIS — Z0000 Encounter for general adult medical examination without abnormal findings: Secondary | ICD-10-CM

## 2020-08-05 NOTE — Patient Instructions (Signed)
Mr. Scott Le , Thank you for taking time to come for your Medicare Wellness Visit. I appreciate your ongoing commitment to your health goals. Please review the following plan we discussed and let me know if I can assist you in the future.   Screening recommendations/referrals: Colonoscopy: Up to date, due 05/2022 Recommended yearly ophthalmology/optometry visit for glaucoma screening and checkup Recommended yearly dental visit for hygiene and checkup  Vaccinations: Influenza vaccine: Done 02/2020 Pneumococcal vaccine: Completed series Tdap vaccine: Up to date, due 03/2026 Shingles vaccine: Shingrix up to date per patient. Requested records from pharmacy to up date chart.    Advanced directives: Please bring a copy of your POA (Power of Attorney) and/or Living Will to your next appointment.   Conditions/risks identified: None.  Next appointment: 10/01/20 @ 9:00 AM with Dr Caryn Section  Preventive Care 71 Years and Older, Male Preventive care refers to lifestyle choices and visits with your health care provider that can promote health and wellness. What does preventive care include?  A yearly physical exam. This is also called an annual well check.  Dental exams once or twice a year.  Routine eye exams. Ask your health care provider how often you should have your eyes checked.  Personal lifestyle choices, including:  Daily care of your teeth and gums.  Regular physical activity.  Eating a healthy diet.  Avoiding tobacco and drug use.  Limiting alcohol use.  Practicing safe sex.  Taking low doses of aspirin every day.  Taking vitamin and mineral supplements as recommended by your health care provider. What happens during an annual well check? The services and screenings done by your health care provider during your annual well check will depend on your age, overall health, lifestyle risk factors, and family history of disease. Counseling  Your health care provider may ask you  questions about your:  Alcohol use.  Tobacco use.  Drug use.  Emotional well-being.  Home and relationship well-being.  Sexual activity.  Eating habits.  History of falls.  Memory and ability to understand (cognition).  Work and work Statistician. Screening  You may have the following tests or measurements:  Height, weight, and BMI.  Blood pressure.  Lipid and cholesterol levels. These may be checked every 5 years, or more frequently if you are over 71 years old.  Skin check.  Lung cancer screening. You may have this screening every year starting at age 71 if you have a 30-pack-year history of smoking and currently smoke or have quit within the past 15 years.  Fecal occult blood test (FOBT) of the stool. You may have this test every year starting at age 71.  Flexible sigmoidoscopy or colonoscopy. You may have a sigmoidoscopy every 5 years or a colonoscopy every 10 years starting at age 71.  Prostate cancer screening. Recommendations will vary depending on your family history and other risks.  Hepatitis C blood test.  Hepatitis B blood test.  Sexually transmitted disease (STD) testing.  Diabetes screening. This is done by checking your blood sugar (glucose) after you have not eaten for a while (fasting). You may have this done every 1-3 years.  Abdominal aortic aneurysm (AAA) screening. You may need this if you are a current or former smoker.  Osteoporosis. You may be screened starting at age 71 if you are at high risk. Talk with your health care provider about your test results, treatment options, and if necessary, the need for more tests. Vaccines  Your health care provider may recommend certain vaccines,  such as:  Influenza vaccine. This is recommended every year.  Tetanus, diphtheria, and acellular pertussis (Tdap, Td) vaccine. You may need a Td booster every 10 years.  Zoster vaccine. You may need this after age 71.  Pneumococcal 13-valent conjugate  (PCV13) vaccine. One dose is recommended after age 71.  Pneumococcal polysaccharide (PPSV23) vaccine. One dose is recommended after age 71. Talk to your health care provider about which screenings and vaccines you need and how often you need them. This information is not intended to replace advice given to you by your health care provider. Make sure you discuss any questions you have with your health care provider. Document Released: 06/20/2015 Document Revised: 02/11/2016 Document Reviewed: 03/25/2015 Elsevier Interactive Patient Education  2017 [place] Prevention in the Home Falls can cause injuries. They can happen to people of all ages. There are many things you can do to make your home safe and to help prevent falls. What can I do on the outside of my home?  Regularly fix the edges of walkways and driveways and fix any cracks.  Remove anything that might make you trip as you walk through a door, such as a raised step or threshold.  Trim any bushes or trees on the path to your home.  Use bright outdoor lighting.  Clear any walking paths of anything that might make someone trip, such as rocks or tools.  Regularly check to see if handrails are loose or broken. Make sure that both sides of any steps have handrails.  Any raised decks and porches should have guardrails on the edges.  Have any leaves, snow, or ice cleared regularly.  Use sand or salt on walking paths during winter.  Clean up any spills in your garage right away. This includes oil or grease spills. What can I do in the bathroom?  Use night lights.  Install grab bars by the toilet and in the tub and shower. Do not use towel bars as grab bars.  Use non-skid mats or decals in the tub or shower.  If you need to sit down in the shower, use a plastic, non-slip stool.  Keep the floor dry. Clean up any water that spills on the floor as soon as it happens.  Remove soap buildup in the tub or shower  regularly.  Attach bath mats securely with double-sided non-slip rug tape.  Do not have throw rugs and other things on the floor that can make you trip. What can I do in the bedroom?  Use night lights.  Make sure that you have a light by your bed that is easy to reach.  Do not use any sheets or blankets that are too big for your bed. They should not hang down onto the floor.  Have a firm chair that has side arms. You can use this for support while you get dressed.  Do not have throw rugs and other things on the floor that can make you trip. What can I do in the kitchen?  Clean up any spills right away.  Avoid walking on wet floors.  Keep items that you use a lot in easy-to-reach places.  If you need to reach something above you, use a strong step stool that has a grab bar.  Keep electrical cords out of the way.  Do not use floor polish or wax that makes floors slippery. If you must use wax, use non-skid floor wax.  Do not have throw rugs and other things on  the floor that can make you trip. What can I do with my stairs?  Do not leave any items on the stairs.  Make sure that there are handrails on both sides of the stairs and use them. Fix handrails that are broken or loose. Make sure that handrails are as long as the stairways.  Check any carpeting to make sure that it is firmly attached to the stairs. Fix any carpet that is loose or worn.  Avoid having throw rugs at the top or bottom of the stairs. If you do have throw rugs, attach them to the floor with carpet tape.  Make sure that you have a light switch at the top of the stairs and the bottom of the stairs. If you do not have them, ask someone to add them for you. What else can I do to help prevent falls?  Wear shoes that:  Do not have high heels.  Have rubber bottoms.  Are comfortable and fit you well.  Are closed at the toe. Do not wear sandals.  If you use a stepladder:  Make sure that it is fully  opened. Do not climb a closed stepladder.  Make sure that both sides of the stepladder are locked into place.  Ask someone to hold it for you, if possible.  Clearly mark and make sure that you can see:  Any grab bars or handrails.  First and last steps.  Where the edge of each step is.  Use tools that help you move around (mobility aids) if they are needed. These include:  Canes.  Walkers.  Scooters.  Crutches.  Turn on the lights when you go into a dark area. Replace any light bulbs as soon as they burn out.  Set up your furniture so you have a clear path. Avoid moving your furniture around.  If any of your floors are uneven, fix them.  If there are any pets around you, be aware of where they are.  Review your medicines with your doctor. Some medicines can make you feel dizzy. This can increase your chance of falling. Ask your doctor what other things that you can do to help prevent falls. This information is not intended to replace advice given to you by your health care provider. Make sure you discuss any questions you have with your health care provider. Document Released: 03/20/2009 Document Revised: 10/30/2015 Document Reviewed: 06/28/2014 Elsevier Interactive Patient Education  2017 [employer].

## 2020-10-01 ENCOUNTER — Other Ambulatory Visit: Payer: Self-pay

## 2020-10-01 ENCOUNTER — Encounter: Payer: Self-pay | Admitting: Family Medicine

## 2020-10-01 ENCOUNTER — Ambulatory Visit (INDEPENDENT_AMBULATORY_CARE_PROVIDER_SITE_OTHER): Payer: Medicare HMO | Admitting: Family Medicine

## 2020-10-01 VITALS — BP 132/79 | HR 47 | Temp 97.0°F | Resp 16 | Ht 68.0 in | Wt 167.4 lb

## 2020-10-01 DIAGNOSIS — Z0000 Encounter for general adult medical examination without abnormal findings: Secondary | ICD-10-CM

## 2020-10-01 DIAGNOSIS — Z125 Encounter for screening for malignant neoplasm of prostate: Secondary | ICD-10-CM

## 2020-10-01 DIAGNOSIS — Z8249 Family history of ischemic heart disease and other diseases of the circulatory system: Secondary | ICD-10-CM | POA: Diagnosis not present

## 2020-10-01 DIAGNOSIS — E785 Hyperlipidemia, unspecified: Secondary | ICD-10-CM | POA: Diagnosis not present

## 2020-10-01 NOTE — Progress Notes (Signed)
Complete physical exam   Patient: Scott Le   DOB: Sep 01, 1950   70 y.o. Male  MRN: [id number] Visit Date: 10/01/2020  Today's healthcare provider: Lelon Huh, MD   Chief Complaint  Patient presents with  . Annual Exam  . Hyperlipidemia   Subjective    Scott Le is a 70 y.o. male who presents today for a complete physical exam.  He reports consuming a general diet. Gym/ health club routine includes cardio and light weights. He generally feels fairly well. He reports sleeping fairly well. He does not have additional problems to discuss today. He is also planning on going on [place] trip next year.    Had AWV with [facility] on 08/05/2020.  HPI  Lipid/Cholesterol, Follow-up  Last lipid panel Other pertinent labs  Lab Results  Component Value Date   CHOL 170 06/21/2019   HDL 48 06/21/2019   LDLCALC 107 (H) 06/21/2019   TRIG 80 06/21/2019   CHOLHDL 3.5 06/21/2019   Lab Results  Component Value Date   ALT 17 06/21/2019   AST 24 06/21/2019   PLT 181 06/21/2019   TSH 2.810 06/21/2019     He was last seen for this 1 year ago (seen by Carles Collet, PA-C).  Management since that visit includes recommended starting a statin; patient declined.  He reports good compliance with treatment. Pataient consumes a healthy diet. He is not having side effects.   Symptoms: No chest pain No chest pressure/discomfort  No dyspnea No lower extremity edema  No numbness or tingling of extremity No orthopnea  No palpitations No paroxysmal nocturnal dyspnea  No speech difficulty No syncope   Current diet: in general, a "healthy" diet   Current exercise: cardiovascular workout on exercise equipment and weightlifting  The 10-year ASCVD risk score Mikey Bussing DC Brooke Bonito., et al., 2013) is: 19.2%  ---------------------------------------------------------------------------------------------------  Past Medical History:  Diagnosis Date  . Arthritis   . Cataract     Corrected  . Seasonal allergies   . Shingles   . Soft tissue injury    Past Surgical History:  Procedure Laterality Date  . EYE SURGERY     Cataracts   . ROTATOR CUFF REPAIR Right   . VASECTOMY     Social History   Socioeconomic History  . Marital status: Married    Spouse name: Not on file  . Number of children: 2  . Years of education: Not on file  . Highest education level: Associate degree: occupational, Hotel manager, or vocational program  Occupational History  . Occupation: retired  Tobacco Use  . Smoking status: Never Smoker  . Smokeless tobacco: Never Used  . Tobacco comment: tried at 30  Vaping Use  . Vaping Use: Never used  Substance and Sexual Activity  . Alcohol use: No    Alcohol/week: 0.0 standard drinks  . Drug use: No  . Sexual activity: Not on file  Other Topics Concern  . Not on file  Social History Narrative  . Not on file   Social Determinants of Health   Financial Resource Strain: Low Risk   . Difficulty of Paying Living Expenses: Not hard at all  Food Insecurity: No Food Insecurity  . Worried About Charity fundraiser in the Last Year: Never true  . Ran Out of Food in the Last Year: Never true  Transportation Needs: No Transportation Needs  . Lack of Transportation (Medical): No  . Lack of Transportation (Non-Medical): No  Physical Activity: Insufficiently  Active  . Days of Exercise per Week: 3 days  . Minutes of Exercise per Session: 40 min  Stress: No Stress Concern Present  . Feeling of Stress : Not at all  Social Connections: Moderately Integrated  . Frequency of Communication with Friends and Family: Twice a week  . Frequency of Social Gatherings with Friends and Family: More than three times a week  . Attends Religious Services: More than 4 times per year  . Active Member of Clubs or Organizations: No  . Attends Archivist Meetings: Never  . Marital Status: Married  Human resources officer Violence: Not At Risk  . Fear of  Current or Ex-Partner: No  . Emotionally Abused: No  . Physically Abused: No  . Sexually Abused: No   Family Status  Relation Name Status  . Father  Deceased  . Mother  Deceased   Family History  Problem Relation Age of Onset  . CAD Father 7       s/p post first MI @ age 45, passed from [place] @ age 23  . CAD Mother 19       first MI @ age 16, s/p multiple stents  . Valvular heart disease Mother        s/p TAVR   Allergies  Allergen Reactions  . Celecoxib     Other reaction(s): Dry Mouth  . Ciprofloxacin Other (See Comments)  . Codeine Nausea And Vomiting  . Levaquin [Levofloxacin] Other (See Comments)    Joint pain  . Nsaids Other (See Comments)    Bleeding gums/gum issues    Patient Care Team: Birdie Sons, MD as PCP - General (Family Medicine) Paulene Floor as Physician Assistant (Physician Assistant) Ralene Bathe, MD (Dermatology) Birder Robson, MD as Referring Physician (Ophthalmology)   Medications: Outpatient Medications Prior to Visit  Medication Sig  . acetaminophen (TYLENOL) 500 MG tablet Take 500 mg by mouth every 6 (six) hours as needed.   Marland Kitchen ketoconazole (NIZORAL) 2 % cream Apply to aa's QHS.  . Multiple Vitamin (MULTIVITAMIN) tablet Take 1 tablet by mouth daily. Occasionally  . mupirocin ointment (BACTROBAN) 2 % Apply 1 application topically daily. Qd to excision site with dressing changes (Patient not taking: Reported on 10/01/2020)   No facility-administered medications prior to visit.    Review of Systems  Constitutional: Negative for appetite change, chills, fatigue and fever.  HENT: Negative for congestion, ear pain, hearing loss, nosebleeds and trouble swallowing.   Eyes: Negative for pain and visual disturbance.  Respiratory: Negative for cough, chest tightness and shortness of breath.   Cardiovascular: Negative for chest pain, palpitations and leg swelling.  Gastrointestinal: Negative for abdominal pain, blood in stool,  constipation, diarrhea, nausea and vomiting.  Endocrine: Negative for polydipsia, polyphagia and polyuria.  Genitourinary: Negative for dysuria and flank pain.  Musculoskeletal: Positive for arthralgias and back pain. Negative for joint swelling, myalgias and neck stiffness.  Skin: Negative for color change, rash and wound.  Neurological: Negative for dizziness, tremors, seizures, speech difficulty, weakness, light-headedness and headaches.  Psychiatric/Behavioral: Negative for behavioral problems, confusion, decreased concentration, dysphoric mood and sleep disturbance. The patient is not nervous/anxious.   All other systems reviewed and are negative.     Objective    BP 132/79 (BP Location: Left Arm, Patient Position: Sitting, Cuff Size: Normal)   Pulse (!) 47   Temp (!) 97 F (36.1 C) (Temporal)   Resp 16   Ht 5\' 8"  (1.727 m)   Wt 167  lb 6.4 oz (75.9 kg)   BMI 25.45 kg/m    Physical Exam   General Appearance:     Well developed, well nourished male. Alert, cooperative, in no acute distress, appears stated age  Head:    Normocephalic, without obvious abnormality, atraumatic  Eyes:    PERRL, conjunctiva/corneas clear, EOM's intact, fundi    benign, both eyes       Ears:    Normal TM's and external ear canals, both ears  Neck:   Supple, symmetrical, trachea midline, no adenopathy;       thyroid:  No enlargement/tenderness/nodules; no carotid   bruit or JVD  Back:     Symmetric, no curvature, ROM normal, no CVA tenderness  Lungs:     Clear to auscultation bilaterally, respirations unlabored  Chest wall:    No tenderness or deformity  Heart:    Bradycardic. Normal rhythm. No murmurs, rubs, or gallops.  S1 and S2 normal  Abdomen:     Soft, non-tender, bowel sounds active all four quadrants,    no masses, no organomegaly  Genitalia:    deferred  Rectal:    deferred  Extremities:   All extremities are intact. No cyanosis or edema  Pulses:   2+ and symmetric all extremities   Skin:   Skin color, texture, turgor normal, no rashes or lesions  Lymph nodes:   Cervical, supraclavicular, and axillary nodes normal  Neurologic:   CNII-XII intact. Normal strength, sensation and reflexes      throughout     Last depression screening scores PHQ 2/9 Scores 08/05/2020 06/18/2019  PHQ - 2 Score 0 0  PHQ- 9 Score - 0   Last fall risk screening Fall Risk  08/05/2020  Falls in the past year? 0  Number falls in past yr: 0  Injury with Fall? 0   Last Audit-C alcohol use screening Alcohol Use Disorder Test (AUDIT) 08/05/2020  1. How often do you have a drink containing alcohol? 0  2. How many drinks containing alcohol do you have on a typical day when you are drinking? 0  3. How often do you have six or more drinks on one occasion? 0  AUDIT-C Score 0  Alcohol Brief Interventions/Follow-up AUDIT Score <7 follow-up not indicated   A score of 3 or more in women, and 4 or more in men indicates increased risk for alcohol abuse, EXCEPT if all of the points are from question 1   No results found for any visits on 10/01/20.  Assessment & Plan    Routine Health Maintenance and Physical Exam  Exercise Activities and Dietary recommendations Goals   None     Immunization History  Administered Date(s) Administered  . Influenza-Unspecified 03/01/2019  . PFIZER(Purple Top)SARS-COV-2 Vaccination 07/14/2019, 08/08/2019  . Pneumococcal Conjugate-13 03/06/2017  . Pneumococcal Polysaccharide-23 02/06/2018  . Rabies, IM 03/09/2016, 03/12/2016, 03/16/2016, 03/23/2016  . Tdap 01/05/2012, 03/09/2016  . Zoster Recombinat (Shingrix) 02/17/2020    Health Maintenance  Topic Date Due  . COVID-19 Vaccine (3 - Pfizer risk 4-dose series) 09/05/2019  . INFLUENZA VACCINE  01/05/2021  . COLONOSCOPY (Pts 45-72yrs Insurance coverage will need to be confirmed)  05/26/2022  . TETANUS/TDAP  03/09/2026  . Hepatitis C Screening  Completed  . PNA vac Low Risk Adult  Completed  . HPV VACCINES  Aged  Out    Discussed health benefits of physical activity, and encouraged him to engage in regular exercise appropriate for his age and condition.   Appears to be in  very good health. Physically active, following healthy diet plan. UTD on routine health maintenance.   1. Prostate cancer screening  - PSA Total (Reflex To Free) ([facility] only)  2. Hyperlipidemia, unspecified hyperlipidemia type  - Comprehensive metabolic panel - Lipid panel  3. Family history of premature coronary heart disease He states his father did have very high cholesterol.      The entirety of the information documented in the History of Present Illness, Review of Systems and Physical Exam were personally obtained by me. Portions of this information were initially documented by the CMA and reviewed by me for thoroughness and accuracy.      Lelon Huh, MD  [facility] [phone] (phone) [phone] (fax)  [place]

## 2020-10-02 LAB — COMPREHENSIVE METABOLIC PANEL
ALT: 16 IU/L (ref 0–44)
AST: 24 IU/L (ref 0–40)
Albumin/Globulin Ratio: 2.2 (ref 1.2–2.2)
Albumin: 4.8 g/dL — ABNORMAL HIGH (ref 3.7–4.7)
Alkaline Phosphatase: 80 IU/L (ref 44–121)
BUN/Creatinine Ratio: 23 (ref 10–24)
BUN: 21 mg/dL (ref 8–27)
Bilirubin Total: 0.6 mg/dL (ref 0.0–1.2)
CO2: 22 mmol/L (ref 20–29)
Calcium: 9.4 mg/dL (ref 8.6–10.2)
Chloride: 102 mmol/L (ref 96–106)
Creatinine, Ser: 0.91 mg/dL (ref 0.76–1.27)
Globulin, Total: 2.2 g/dL (ref 1.5–4.5)
Glucose: 99 mg/dL (ref 65–99)
Potassium: 4.4 mmol/L (ref 3.5–5.2)
Sodium: 141 mmol/L (ref 134–144)
Total Protein: 7 g/dL (ref 6.0–8.5)
eGFR: 90 mL/min/{1.73_m2} (ref >59)

## 2020-10-02 LAB — LIPID PANEL
Chol/HDL Ratio: 3.2 ratio (ref 0.0–5.0)
Cholesterol, Total: 181 mg/dL (ref 100–199)
HDL: 56 mg/dL (ref >39)
LDL Chol Calc (NIH): 112 mg/dL — ABNORMAL HIGH (ref 0–99)
Triglycerides: 68 mg/dL (ref 0–149)
VLDL Cholesterol Cal: 13 mg/dL (ref 5–40)

## 2020-10-02 LAB — PSA TOTAL (REFLEX TO FREE): Prostate Specific Ag, Serum: 0.3 ng/mL (ref 0.0–4.0)

## 2021-02-07 ENCOUNTER — Encounter: Payer: Self-pay | Admitting: Emergency Medicine

## 2021-02-07 ENCOUNTER — Other Ambulatory Visit: Payer: Self-pay

## 2021-02-07 ENCOUNTER — Emergency Department: Payer: Medicare HMO

## 2021-02-07 ENCOUNTER — Observation Stay
Admission: EM | Admit: 2021-02-07 | Discharge: 2021-02-08 | Disposition: A | Discharge status: Home / Self Care | Payer: Medicare HMO | Attending: Student | Admitting: Student

## 2021-02-07 DIAGNOSIS — R030 Elevated blood-pressure reading, without diagnosis of hypertension: Secondary | ICD-10-CM

## 2021-02-07 DIAGNOSIS — M549 Dorsalgia, unspecified: Secondary | ICD-10-CM | POA: Diagnosis not present

## 2021-02-07 DIAGNOSIS — N179 Acute kidney failure, unspecified: Secondary | ICD-10-CM | POA: Diagnosis not present

## 2021-02-07 DIAGNOSIS — Z20822 Contact with and (suspected) exposure to covid-19: Secondary | ICD-10-CM | POA: Insufficient documentation

## 2021-02-07 DIAGNOSIS — R9431 Abnormal electrocardiogram [ECG] [EKG]: Secondary | ICD-10-CM | POA: Insufficient documentation

## 2021-02-07 DIAGNOSIS — R55 Syncope and collapse: Principal | ICD-10-CM | POA: Insufficient documentation

## 2021-02-07 DIAGNOSIS — R7989 Other specified abnormal findings of blood chemistry: Secondary | ICD-10-CM

## 2021-02-07 DIAGNOSIS — R778 Other specified abnormalities of plasma proteins: Secondary | ICD-10-CM | POA: Diagnosis not present

## 2021-02-07 LAB — URINALYSIS, COMPLETE (UACMP) WITH MICROSCOPIC
Bacteria, UA: NONE SEEN
Bilirubin Urine: NEGATIVE
Glucose, UA: NEGATIVE mg/dL
Leukocytes,Ua: NEGATIVE
Nitrite: NEGATIVE
Protein, ur: NEGATIVE mg/dL
Specific Gravity, Urine: <1.005 — ABNORMAL LOW (ref 1.005–1.030)
pH: 5 (ref 5.0–8.0)

## 2021-02-07 LAB — CBC
HCT: 41.3 % (ref 39.0–52.0)
Hemoglobin: 14.1 g/dL (ref 13.0–17.0)
MCH: 29.3 pg (ref 26.0–34.0)
MCHC: 34.1 g/dL (ref 30.0–36.0)
MCV: 85.7 fL (ref 80.0–100.0)
Platelets: 150 10*3/uL (ref 150–400)
RBC: 4.82 MIL/uL (ref 4.22–5.81)
RDW: 15.5 % (ref 11.5–15.5)
WBC: 7.5 10*3/uL (ref 4.0–10.5)
nRBC: 0 % (ref 0.0–0.2)

## 2021-02-07 LAB — BASIC METABOLIC PANEL
Anion gap: 8 (ref 5–15)
BUN: 28 mg/dL — ABNORMAL HIGH (ref 8–23)
CO2: 28 mmol/L (ref 22–32)
Calcium: 9.8 mg/dL (ref 8.9–10.3)
Chloride: 102 mmol/L (ref 98–111)
Creatinine, Ser: 1.4 mg/dL — ABNORMAL HIGH (ref 0.61–1.24)
GFR, Estimated: 54 mL/min — ABNORMAL LOW (ref >60)
Glucose, Bld: 102 mg/dL — ABNORMAL HIGH (ref 70–99)
Potassium: 4.4 mmol/L (ref 3.5–5.1)
Sodium: 138 mmol/L (ref 135–145)

## 2021-02-07 LAB — HEPATIC FUNCTION PANEL
ALT: 20 U/L (ref 0–44)
AST: 28 U/L (ref 15–41)
Albumin: 4.7 g/dL (ref 3.5–5.0)
Alkaline Phosphatase: 71 U/L (ref 38–126)
Bilirubin, Direct: 0.1 mg/dL (ref 0.0–0.2)
Indirect Bilirubin: 0.9 mg/dL (ref 0.3–0.9)
Total Bilirubin: 1 mg/dL (ref 0.3–1.2)
Total Protein: 7.4 g/dL (ref 6.5–8.1)

## 2021-02-07 LAB — TROPONIN I (HIGH SENSITIVITY)
Troponin I (High Sensitivity): 18 ng/L — ABNORMAL HIGH (ref <18)
Troponin I (High Sensitivity): 20 ng/L — ABNORMAL HIGH (ref <18)

## 2021-02-07 LAB — CK: Total CK: 95 U/L (ref 49–397)

## 2021-02-07 LAB — LACTIC ACID, PLASMA: Lactic Acid, Venous: 1.1 mmol/L (ref 0.5–1.9)

## 2021-02-07 MED ORDER — ONDANSETRON HCL 4 MG/2ML IJ SOLN: 4.0000 mg | Freq: Four times a day (QID) | INTRAMUSCULAR | Status: DC | PRN

## 2021-02-07 MED ORDER — SODIUM CHLORIDE 0.9 % IV BOLUS
1000.0000 mL | Freq: Once | INTRAVENOUS | Status: AC
  Administered 2021-02-07: 1000 mL via INTRAVENOUS

## 2021-02-07 MED ORDER — POLYETHYLENE GLYCOL 3350 17 G PO PACK: 17.0000 g | PACK | Freq: Every day | ORAL | Status: DC | PRN

## 2021-02-07 MED ORDER — ADULT MULTIVITAMIN W/MINERALS CH
1.0000 | ORAL_TABLET | Freq: Every day | ORAL | Status: DC
  Administered 2021-02-08: 1 via ORAL
  Filled 2021-02-07: qty 1

## 2021-02-07 MED ORDER — ACETAMINOPHEN 650 MG RE SUPP: 650.0000 mg | Freq: Four times a day (QID) | RECTAL | Status: DC | PRN

## 2021-02-07 MED ORDER — TRAZODONE HCL 50 MG PO TABS: 25.0000 mg | ORAL_TABLET | Freq: Every evening | ORAL | Status: DC | PRN

## 2021-02-07 MED ORDER — ACETAMINOPHEN 325 MG PO TABS: 650.0000 mg | ORAL_TABLET | Freq: Four times a day (QID) | ORAL | Status: DC | PRN

## 2021-02-07 MED ORDER — HYDROMORPHONE HCL 1 MG/ML IJ SOLN
0.5000 mg | Freq: Once | INTRAMUSCULAR | Status: AC
  Administered 2021-02-07: 0.5 mg via INTRAVENOUS
  Filled 2021-02-07: qty 1

## 2021-02-07 MED ORDER — LACTATED RINGERS IV SOLN: INTRAVENOUS | Status: DC

## 2021-02-07 MED ORDER — ONDANSETRON HCL 4 MG/2ML IJ SOLN
4.0000 mg | Freq: Once | INTRAMUSCULAR | Status: AC
  Administered 2021-02-07: 4 mg via INTRAVENOUS
  Filled 2021-02-07: qty 2

## 2021-02-07 MED ORDER — ASPIRIN 81 MG PO CHEW
324.0000 mg | CHEWABLE_TABLET | Freq: Once | ORAL | Status: AC
  Administered 2021-02-07: 324 mg via ORAL
  Filled 2021-02-07: qty 4

## 2021-02-07 MED ORDER — ONDANSETRON HCL 4 MG PO TABS: 4.0000 mg | ORAL_TABLET | Freq: Four times a day (QID) | ORAL | Status: DC | PRN

## 2021-02-07 MED ORDER — IOHEXOL 350 MG/ML SOLN
100.0000 mL | Freq: Once | INTRAVENOUS | Status: AC | PRN
  Administered 2021-02-07: 100 mL via INTRAVENOUS

## 2021-02-07 MED ORDER — SODIUM CHLORIDE 0.9% FLUSH
3.0000 mL | Freq: Two times a day (BID) | INTRAVENOUS | Status: DC
  Administered 2021-02-07: 3 mL via INTRAVENOUS

## 2021-02-07 NOTE — ED Notes (Signed)
Transport to 213 in

## 2021-02-07 NOTE — ED Triage Notes (Signed)
Pt brought in Via EMS with back pain after working in the yard and passed out in the shower. He sat down in a chair so he did not fall in the shower. VSS

## 2021-02-07 NOTE — ED Notes (Signed)
Informed RN bed assigned 

## 2021-02-07 NOTE — ED Provider Notes (Signed)
[facility] Emergency Department Provider Note  ____________________________________________   Event Date/Time   First MD Initiated Contact with Patient 02/07/21 1550     (approximate)  I have reviewed the triage vital signs and the nursing notes.   HISTORY  Chief Complaint Near Syncope and Back Pain    HPI Scott Le is a 70 y.o. male  with past medical history as below here with severe back pain and syncopal episode.  The patient states that he was in his usual state of health.  He worked outside in the yard for approximately 2 hours.  He went back inside and began taking a shower in order to get ready to go to his daughters.  He reports that he experienced acute, severe, 10 out of 10, sharp, stabbing, right paraspinal back pain that felt stabbing and aching.  He then immediately got lightheaded and fell like he is going to pass out.  He quickly sat down.  Then lost consciousness.  He had some nausea but no vomiting.  He quickly regained consciousness, and the pain gradually improved to the point that he was able to get out until his wife he needed to come to the ER.  Denies any trauma or known injury during his yard work.  He does have a history of previous back injuries but states this feels different from his musculoskeletal pain.  Denies any hematuria.  No flank pain currently.  The pain is mostly in the right paraspinal lumbar spine.  Denies any lower extremity weakness or numbness and the pain did not radiate down.  No loss of bowel or bladder function.  No fevers or chills.  No known history of aortic pathology.       Past Medical History:  Diagnosis Date   Arthritis    Cataract    Corrected   Seasonal allergies    Shingles    Soft tissue injury     Patient Active Problem List   Diagnosis Date Noted   Syncope 02/07/2021   DDD (degenerative disc disease), lumbar 08/09/2019   OA (osteoarthritis) of hip 08/09/2019   Digital mucous cyst  06/06/2018   Varicose veins of bilateral lower extremities with pain 01/17/2018   Arthritis 01/17/2018   Hyperlipidemia 01/17/2018   Family history of premature coronary heart disease 07/23/2015   Benign localized hyperplasia of prostate with urinary obstruction 05/06/2014   Atrophy of testis 05/06/2014   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 Authorizing Provider  acetaminophen (TYLENOL) 500 MG tablet Take 500 mg by mouth daily as needed.   Yes [provider]  Cholecalciferol (VITAMIN D) 125 MCG (5000 UT) CAPS Take 125 mcg by mouth daily.   Yes [provider]  Multiple Vitamin (MULTIVITAMIN) tablet Take 1 tablet by mouth daily. Occasionally   Yes [provider]  mupirocin ointment (BACTROBAN) 2 % Apply 1 application topically daily. Qd to excision site with dressing changes 05/06/20  Yes Ralene Bathe, MD  vitamin C (ASCORBIC ACID) 500 MG tablet Take 500 mg by mouth daily.   Yes [provider]  zinc gluconate 50 MG tablet Take 50 mg by mouth daily.   Yes [provider]  ketoconazole (NIZORAL)  2 % cream Apply to aa's QHS. Patient not taking: Reported on 02/07/2021 03/05/20   Ralene Bathe, MD    Allergies Celecoxib, Ciprofloxacin, Codeine, Levaquin [levofloxacin], and Nsaids  Family History  Problem Relation Age of Onset   CAD Father 109       s/p post first MI @ age 97, passed from [place] @ age 38   CAD Mother 63       first MI @ age 64, s/p multiple stents   Valvular heart disease Mother        s/p TAVR    Social History Social History   Tobacco Use   Smoking status: Never   Smokeless tobacco: Never   Tobacco comments:    tried at 49  Vaping Use   Vaping Use: Never used  Substance Use Topics    Alcohol use: No    Alcohol/week: 0.0 standard drinks   Drug use: No    Review of Systems  Review of Systems  Constitutional:  Positive for fatigue. Negative for chills and fever.  HENT:  Negative for sore throat.   Respiratory:  Negative for shortness of breath.   Cardiovascular:  Negative for chest pain.  Gastrointestinal:  Positive for nausea. Negative for abdominal pain.  Genitourinary:  Negative for flank pain.  Musculoskeletal:  Positive for back pain. Negative for neck pain.  Skin:  Negative for rash and wound.  Allergic/Immunologic: Negative for immunocompromised state.  Neurological:  Positive for syncope. Negative for weakness and numbness.  Hematological:  Does not bruise/bleed easily.  All other systems reviewed and are negative.   ____________________________________________  PHYSICAL EXAM:      VITAL SIGNS: ED Triage Vitals  Enc Vitals Group     BP 02/07/21 1420 108/81     Pulse Rate 02/07/21 1420 71     Resp 02/07/21 1420 16     Temp 02/07/21 1420 98.4 F (36.9 C)     Temp Source 02/07/21 1420 Oral     SpO2 02/07/21 1420 98 %     Weight 02/07/21 1421 174 lb (78.9 kg)     Height 02/07/21 1421 '5\' 8"'$  (1.727 m)     Head Circumference --      Peak Flow --      Pain Score 02/07/21 1425 8     Pain Loc --      Pain Edu? --      Excl. in [place]? --      Physical Exam Vitals and nursing note reviewed.  Constitutional:      General: He is not in acute distress.    Appearance: He is well-developed.  HENT:     Head: Normocephalic and atraumatic.  Eyes:     Conjunctiva/sclera: Conjunctivae normal.  Cardiovascular:     Rate and Rhythm: Normal rate and regular rhythm.     Heart sounds: Normal heart sounds. No murmur heard.   No friction rub.  Pulmonary:     Effort: Pulmonary effort is normal. No respiratory distress.     Breath sounds: Normal breath sounds. No wheezing or rales.  Abdominal:     General: There is no distension.     Palpations: Abdomen is soft.      Tenderness: There is no abdominal tenderness.     Comments: No appreciable right upper quadrant or right lower quadrant tenderness.  No rebound or guarding.  No pulsatile mass.  Musculoskeletal:     Cervical back: Neck supple.     Comments: Moderate tenderness to  palpation over the right lumbar paraspinal area, no midline deformity.  No step-offs.  No CVA tenderness.  Skin:    General: Skin is warm.     Capillary Refill: Capillary refill takes less than 2 seconds.  Neurological:     Mental Status: He is alert and oriented to person, place, and time.     Motor: No abnormal muscle tone.      ____________________________________________   LABS (all labs ordered are listed, but only abnormal results are displayed)  Labs Reviewed  BASIC METABOLIC PANEL - Abnormal; Notable for the following components:      Result Value   Glucose, Bld 102 (*)    BUN 28 (*)    Creatinine, Ser 1.40 (*)    GFR, Estimated 54 (*)    All other components within normal limits  TROPONIN I (HIGH SENSITIVITY) - Abnormal; Notable for the following components:   Troponin I (High Sensitivity) 18 (*)    All other components within normal limits  CBC  LACTIC ACID, PLASMA  HEPATIC FUNCTION PANEL  URINALYSIS, COMPLETE (UACMP) WITH MICROSCOPIC  CBG MONITORING, ED    ____________________________________________  EKG: Normal sinus rhythm, ventricular rate 65.  PR 166, QRS 76, QTc 4 9.  No acute ST elevations or depressions.  No acute events of acute ischemia or infarct. ________________________________________  RADIOLOGY All imaging, including plain films, CT scans, and ultrasounds, independently reviewed by me, and interpretations confirmed via formal radiology reads.  ED MD interpretation:   CT chest/abdomen/pelvis: No acute abnormality of the aorta, superior endplate deformity noted at T11 and L3, likely chronic, otherwise chronic changes without acute abnormality  Official radiology report(s): CT  Angio Chest/Abd/Pel for Dissection W and/or Wo Contrast  Result Date: 02/07/2021 CLINICAL DATA:  Back pain after working in the yard, passed out in shower. EXAM: CT ANGIOGRAPHY CHEST, ABDOMEN AND PELVIS TECHNIQUE: Non-contrast CT of the chest was initially obtained. Multidetector CT imaging through the chest, abdomen and pelvis was performed using the standard protocol during bolus administration of intravenous contrast. Multiplanar reconstructed images and MIPs were obtained and reviewed to evaluate the vascular anatomy. CONTRAST:  157m OMNIPAQUE IOHEXOL 350 MG/ML SOLN COMPARISON:  None. FINDINGS: CTA CHEST FINDINGS Cardiovascular: Noncontrast sequence demonstrates scatter aortic atherosclerosis without findings of intramural hematoma. Preferential opacification of the thoracic aorta. No evidence of thoracic aortic aneurysm or dissection. No central pulmonary embolus. Normal caliber central pulmonary arteries. Normal heart size. No pericardial effusion. Mediastinum/Nodes: Heterogeneous 2.2 cm right thyroid nodule. No pathologically enlarged mediastinal, hilar or axillary lymph nodes. The trachea and esophagus are unremarkable. Lungs/Pleura: Bibasilar atelectasis. No suspicious pulmonary nodules or masses. No focal airspace consolidation. No pleural effusion. No pneumothorax. Musculoskeletal: Multilevel degenerative changes spine. Age indeterminate superior endplate compression deformity at T11 with approximately 40% height loss. No aggressive lytic or blastic lesion of bone. Surgical anchors in the right humeral head. Severe degenerative change of the left glenohumeral joint. Degenerative change of the right glenohumeral joint. Review of the MIP images confirms the above findings. CTA ABDOMEN AND PELVIS FINDINGS VASCULAR Aorta: Aortic atherosclerosis. Normal caliber aorta without aneurysm, dissection, vasculitis or significant stenosis. Celiac: Patent without evidence of aneurysm, dissection, vasculitis or  significant stenosis. SMA: Patent without evidence of aneurysm, dissection, vasculitis or significant stenosis. Renals: Both renal arteries are patent without evidence of aneurysm, dissection, vasculitis, fibromuscular dysplasia or significant stenosis. IMA: Patent without evidence of aneurysm, dissection, vasculitis or significant stenosis. Inflow: Patent without evidence of aneurysm, dissection, vasculitis or significant stenosis. Veins: No obvious  venous abnormality within the limitations of this arterial phase study. Review of the MIP images confirms the above findings. NON-VASCULAR Hepatobiliary: Hyperenhancing 7 mm focus in the right lobe of the liver on image 93/5, incompletely evaluated but likely reflecting a flash filling hemangioma. Hepatic parenchyma is otherwise unremarkable without fissural widening or contour nodularity. Gallbladder is unremarkable. No biliary ductal dilation. Pancreas: No pancreatic ductal dilatation or surrounding inflammatory changes. Spleen: No splenic injury or perisplenic hematoma. Adrenals/Urinary Tract: Bilateral adrenal glands are unremarkable. No hydronephrosis. Scarring in the upper and lower pole the left kidney. Urinary bladder is unremarkable for degree of distension. Stomach/Bowel: Small hiatal hernia otherwise the stomach is unremarkable. No pathologic dilation of small or large bowel. The appendix and terminal ileum appear normal. Small volume of formed stool throughout the colon. Scattered colonic diverticula without findings of acute diverticulosis. No evidence of acute bowel inflammation. Lymphatic: No pathologically enlarged abdominal or pelvic lymph nodes. Reproductive: Prostate is unremarkable. Other: No abdominopelvic ascites. Musculoskeletal: Levoconvex curvature of the lumbar spine. Multilevel degenerative change of the lumbar spine. Mild anterior wedging of the L3 vertebral body with approximately 25% height loss, technically age indeterminate but appearing  remote. Grade 1 degenerative L5 on S1 anterolisthesis. Degenerative changes bilateral hips. No acute osseous abnormality. Review of the MIP images confirms the above findings. IMPRESSION: 1. No evidence of aortic aneurysm or dissection. 2. Superior endplate compression deformity at T11 with approximately 40% height loss and L3 vertebral body with approximately 25% height loss, technically age indeterminate but remote in appearance remote. Recommend correlation with point tenderness to assess acuity. 3. Heterogeneous 2.2 cm right thyroid nodule. Recommend thyroid US (ref: J Am Coll Radiol. 2015 Feb;12(2): 143-50). 4. Hyperenhancing 7 mm focus in the right lobe of the liver on image 93/5, incompletely evaluated but in a non cirrhotic liver this likely refle reflects cting a flash filling hemangioma. More definitive characterization could be obtained with nonemergent liver protocol abdominal MRI with and without contrast if clinically indicated. 5. Colonic diverticulosis without findings of acute diverticulosis. 6. Small hiatal hernia. 7.  Aortic Atherosclerosis (ICD10-I70.0). Electronically Signed   By: Dahlia Bailiff M.D.   On: 02/07/2021 17:26    ____________________________________________  PROCEDURES   Procedure(s) performed (including Critical Care):  .1-3 Lead EKG Interpretation  Date/Time: 02/07/2021 7:18 PM Performed by: Duffy Bruce, MD Authorized by: Duffy Bruce, MD     Interpretation: normal     ECG rate:  70-90   ECG rate assessment: normal     Rhythm: sinus rhythm     Ectopy: none     Conduction: normal   Comments:     Indication: syncope  ____________________________________________  INITIAL IMPRESSION / MDM / ASSESSMENT AND PLAN / ED COURSE  As part of my medical decision making, I reviewed the following data within the [place] notes reviewed and incorporated, Old chart reviewed, Notes from prior ED visits, and [place] Controlled Substance  [place] was evaluated in Emergency Department on 02/07/2021 for the symptoms described in the history of present illness. He was evaluated in the context of the global COVID-19 pandemic, which necessitated consideration that the patient might be at risk for infection with the SARS-CoV-2 virus that causes COVID-19. Institutional protocols and algorithms that pertain to the evaluation of patients at risk for COVID-19 are in a state of rapid change based on information released by regulatory bodies including the CDC and federal and state organizations. These policies  and algorithms were followed during the patient's care in the ED.  Some ED evaluations and interventions may be delayed as a result of limited staffing during the pandemic.*     Medical Decision Making: 70 year old male here with back pain and syncopal episode.  Clinically, I suspect acute onset of back pain in the setting of known thoracic compression fracture with likely degenerative disc disease and possible paraspinal muscle spasm, with subsequent pain related vagal syncope.  Lab work does show mild dehydration which is consistent with him being out in the yard.  Patient also has a slight troponin elevation at 18.  Unclear significance of this.  He has no chest pain currently.  EKG does show some nonspecific T wave changes.  While I suspect this could be demand and related to his dehydration and slight elevated creatinine, will admit for observation.  Patient updated and is in agreement with this plan.  No ectopy noted on telemetry.  ____________________________________________  FINAL CLINICAL IMPRESSION(S) / ED DIAGNOSES  Final diagnoses:  Syncope, unspecified syncope type  Elevated troponin     MEDICATIONS GIVEN DURING THIS VISIT:  Medications  sodium chloride 0.9 % bolus 1,000 mL (0 mLs Intravenous Stopped 02/07/21 1841)  ondansetron (ZOFRAN) injection 4 mg (4 mg Intravenous Given 02/07/21 1629)   HYDROmorphone (DILAUDID) injection 0.5 mg (0.5 mg Intravenous Given 02/07/21 1634)  iohexol (OMNIPAQUE) 350 MG/ML injection 100 mL (100 mLs Intravenous Contrast Given 02/07/21 1656)  aspirin chewable tablet 324 mg (324 mg Oral Given 02/07/21 1800)     ED Discharge Orders     None        Note:  This document was prepared using Dragon voice recognition software and may include unintentional dictation errors.   Duffy Bruce, MD 02/07/21 1919

## 2021-02-07 NOTE — H&P (Signed)
[facility] History and Physical  Scott Le [id number] DOB: [birth year] DOA: 02/07/2021  Referring physician: Dr. Ellender Hose PCP: Birdie Sons, MD   Chief Complaint: syncope  HPI: Scott Le is a 70 y.o. male with hx of osteoarthritis, hyperlipidemia, who presents after an episode of syncope.  Patient reports that earlier today he was going outside to do some yard work.  He then came in and was stepping into the shower when all of a sudden he felt an intense pain in his lower right back.  He initially thought that it was a kidney stone as the family member had described very similar symptoms.  The pain became incredibly intense very quickly and he sat down in a bench inside his shower and reports that he blacked out.  He feels like he could still hear things but he could not see things.  He is not sure how long this lasted, perhaps 2 to 3 minutes.  Eventually the pain started to ease off, so he got out of the shower and asked his wife to call 911 as he felt that something was seriously wrong.  Patient denies any chest pain during this whole episode.  Pain did not radiate or wraparound.  He did feel some nausea at the same time as he was having intense pain but no sweating.  He reports that the pain stated a constant medium level until he came to the ED and received some Dilaudid, at present he does not have any pain.  Patient also notes that he is recently started going to the gym more frequently in the past week, he is planning to do a through hike of the [place] and was trying to get into shape.  The yard work he was completing earlier in the day was chopping down a tree.  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  He does have a history of a burst fracture in one of his vertebra as a side effect of falling off a roof when he was a Clinical biochemist.  Pain he experienced was not midline but was in the right lower back.  He  denies knowledge of any other medical problems, he does not take any medications except some that he was prescribed a long time ago for neuropathic side effects of his shingles infection.  In the ED initial vital signs notable only for mild hypertension.  Lab work-up showed normal CBC, CMP with creatinine of 1.4 (baseline appears to be 0.9-1.0) but otherwise unremarkable, lactic acid normal.  Troponin was barely elevated at 18, EKG showed sinus rhythm with T wave inversion in lead III, flattening and possible inversion in aVF, possible biphasic T wave in V6, compared to prior there is no significant change.  CTA of chest abdomen and pelvis showed no acute findings, and old T11 compression fracture was seen as well as several incidental findings.  He was given full dose aspirin, 2 L normal saline bolus, and admitted for observation.  Review of Systems:  Pertinent positives and negative per HPI, all others reviewed and negative  Past Medical History:  Diagnosis Date   Arthritis    Cataract    Corrected   Seasonal allergies    Shingles    Soft tissue injury    Past Surgical History:  Procedure Laterality Date   EYE SURGERY     Cataracts    ROTATOR CUFF REPAIR Right    VASECTOMY  Social History:  reports that he has never smoked. He has never used smokeless tobacco. He reports that he does not drink alcohol and does not use drugs.  Allergies  Allergen Reactions   Celecoxib     Other reaction(s): Dry Mouth   Ciprofloxacin Other (See Comments)   Codeine Nausea And Vomiting   Levaquin [Levofloxacin] Other (See Comments)    Joint pain   Nsaids Other (See Comments)    Bleeding gums/gum issues    Family History  Problem Relation Age of Onset   CAD Father 68       s/p post first MI @ age 78, passed from MI @ age 79   CAD Mother 59       first MI @ age 45, s/p multiple stents   Valvular heart disease Mother        s/p TAVR     Prior to Admission medications   Medication Sig  Start Date End Date Taking? Authorizing Provider  acetaminophen (TYLENOL) 500 MG tablet Take 500 mg by mouth daily as needed.   Yes [provider]  Cholecalciferol (VITAMIN D) 125 MCG (5000 UT) CAPS Take 125 mcg by mouth daily.   Yes [provider]  Multiple Vitamin (MULTIVITAMIN) tablet Take 1 tablet by mouth daily. Occasionally   Yes [provider]  mupirocin ointment (BACTROBAN) 2 % Apply 1 application topically daily. Qd to excision site with dressing changes 05/06/20  Yes Ralene Bathe, MD  vitamin C (ASCORBIC ACID) 500 MG tablet Take 500 mg by mouth daily.   Yes [provider]  zinc gluconate 50 MG tablet Take 50 mg by mouth daily.   Yes [provider]  ketoconazole (NIZORAL) 2 % cream Apply to aa's QHS. Patient not taking: Reported on 02/07/2021 03/05/20   Ralene Bathe, MD   Physical Exam: Vitals:   02/07/21 1421 02/07/21 1646 02/07/21 1759 02/07/21 1842  BP:  (!) 142/91 (!) 142/86 (!) 130/92  Pulse:  (!) 59 63 72  Resp:  '18 18 18  '$ Temp:      TempSrc:      SpO2:  100% 100% 100%  Weight: 78.9 kg     Height: '5\' 8"'$  (1.727 m)       Wt Readings from Last 3 Encounters:  02/07/21 78.9 kg  10/01/20 75.9 kg  03/18/20 72.9 kg     General:  Appears calm and comfortable Eyes: PERRL, normal lids, irises & conjunctiva ENT: grossly normal hearing, lips & tongue Neck: no masses Cardiovascular: RRR, no m/r/g. No LE edema.  Respiratory: CTA bilaterally, no w/r/r. Normal respiratory effort. Abdomen: soft, ntnd Skin: no rash or induration seen on limited exam Musculoskeletal: grossly normal tone BUE/BLE. No spinous process tenderness throughout spine. Muscles diffusely tight, unable to appreciate significant difference between L and R paraspinal musculature. Psychiatric: grossly normal mood and affect, speech fluent and appropriate Neurologic: grossly non-focal.          Labs on Admission:  Basic Metabolic Panel: Recent Labs   Lab 02/07/21 1431  NA 138  K 4.4  CL 102  CO2 28  GLUCOSE 102*  BUN 28*  CREATININE 1.40*  CALCIUM 9.8   Liver Function Tests: Recent Labs  Lab 02/07/21 1431  AST 28  ALT 20  ALKPHOS 71  BILITOT 1.0  PROT 7.4  ALBUMIN 4.7   No results for input(s): LIPASE, AMYLASE in the last 168 hours. No results for input(s): AMMONIA in the last 168 hours. CBC: Recent  Labs  Lab 02/07/21 1431  WBC 7.5  HGB 14.1  HCT 41.3  MCV 85.7  PLT 150   Cardiac Enzymes: No results for input(s): CKTOTAL, CKMB, CKMBINDEX, TROPONINI in the last 168 hours.  BNP (last 3 results) No results for input(s): BNP in the last 8760 hours.  ProBNP (last 3 results) No results for input(s): PROBNP in the last 8760 hours.  CBG: No results for input(s): GLUCAP in the last 168 hours.  Radiological Exams on Admission: CT Angio Chest/Abd/Pel for Dissection W and/or Wo Contrast  Result Date: 02/07/2021 CLINICAL DATA:  Back pain after working in the yard, passed out in shower. EXAM: CT ANGIOGRAPHY CHEST, ABDOMEN AND PELVIS TECHNIQUE: Non-contrast CT of the chest was initially obtained. Multidetector CT imaging through the chest, abdomen and pelvis was performed using the standard protocol during bolus administration of intravenous contrast. Multiplanar reconstructed images and MIPs were obtained and reviewed to evaluate the vascular anatomy. CONTRAST:  184m OMNIPAQUE IOHEXOL 350 MG/ML SOLN COMPARISON:  None. FINDINGS: CTA CHEST FINDINGS Cardiovascular: Noncontrast sequence demonstrates scatter aortic atherosclerosis without findings of intramural hematoma. Preferential opacification of the thoracic aorta. No evidence of thoracic aortic aneurysm or dissection. No central pulmonary embolus. Normal caliber central pulmonary arteries. Normal heart size. No pericardial effusion. Mediastinum/Nodes: Heterogeneous 2.2 cm right thyroid nodule. No pathologically enlarged mediastinal, hilar or axillary lymph nodes. The trachea  and esophagus are unremarkable. Lungs/Pleura: Bibasilar atelectasis. No suspicious pulmonary nodules or masses. No focal airspace consolidation. No pleural effusion. No pneumothorax. Musculoskeletal: Multilevel degenerative changes spine. Age indeterminate superior endplate compression deformity at T11 with approximately 40% height loss. No aggressive lytic or blastic lesion of bone. Surgical anchors in the right humeral head. Severe degenerative change of the left glenohumeral joint. Degenerative change of the right glenohumeral joint. Review of the MIP images confirms the above findings. CTA ABDOMEN AND PELVIS FINDINGS VASCULAR Aorta: Aortic atherosclerosis. Normal caliber aorta without aneurysm, dissection, vasculitis or significant stenosis. Celiac: Patent without evidence of aneurysm, dissection, vasculitis or significant stenosis. SMA: Patent without evidence of aneurysm, dissection, vasculitis or significant stenosis. Renals: Both renal arteries are patent without evidence of aneurysm, dissection, vasculitis, fibromuscular dysplasia or significant stenosis. IMA: Patent without evidence of aneurysm, dissection, vasculitis or significant stenosis. Inflow: Patent without evidence of aneurysm, dissection, vasculitis or significant stenosis. Veins: No obvious venous abnormality within the limitations of this arterial phase study. Review of the MIP images confirms the above findings. NON-VASCULAR Hepatobiliary: Hyperenhancing 7 mm focus in the right lobe of the liver on image 93/5, incompletely evaluated but likely reflecting a flash filling hemangioma. Hepatic parenchyma is otherwise unremarkable without fissural widening or contour nodularity. Gallbladder is unremarkable. No biliary ductal dilation. Pancreas: No pancreatic ductal dilatation or surrounding inflammatory changes. Spleen: No splenic injury or perisplenic hematoma. Adrenals/Urinary Tract: Bilateral adrenal glands are unremarkable. No hydronephrosis.  Scarring in the upper and lower pole the left kidney. Urinary bladder is unremarkable for degree of distension. Stomach/Bowel: Small hiatal hernia otherwise the stomach is unremarkable. No pathologic dilation of small or large bowel. The appendix and terminal ileum appear normal. Small volume of formed stool throughout the colon. Scattered colonic diverticula without findings of acute diverticulosis. No evidence of acute bowel inflammation. Lymphatic: No pathologically enlarged abdominal or pelvic lymph nodes. Reproductive: Prostate is unremarkable. Other: No abdominopelvic ascites. Musculoskeletal: Levoconvex curvature of the lumbar spine. Multilevel degenerative change of the lumbar spine. Mild anterior wedging of the L3 vertebral body with approximately 25% height loss, technically age indeterminate but appearing  remote. Grade 1 degenerative L5 on S1 anterolisthesis. Degenerative changes bilateral hips. No acute osseous abnormality. Review of the MIP images confirms the above findings. IMPRESSION: 1. No evidence of aortic aneurysm or dissection. 2. Superior endplate compression deformity at T11 with approximately 40% height loss and L3 vertebral body with approximately 25% height loss, technically age indeterminate but remote in appearance remote. Recommend correlation with point tenderness to assess acuity. 3. Heterogeneous 2.2 cm right thyroid nodule. Recommend thyroid US (ref: J Am Coll Radiol. 2015 Feb;12(2): 143-50). 4. Hyperenhancing 7 mm focus in the right lobe of the liver on image 93/5, incompletely evaluated but in a non cirrhotic liver this likely refle reflects cting a flash filling hemangioma. More definitive characterization could be obtained with nonemergent liver protocol abdominal MRI with and without contrast if clinically indicated. 5. Colonic diverticulosis without findings of acute diverticulosis. 6. Small hiatal hernia. 7.  Aortic Atherosclerosis (ICD10-I70.0). Electronically Signed   By:  Dahlia Bailiff M.D.   On: 02/07/2021 17:26    EKG: Independently reviewed. Sinus rhythm with T wave inversion in lead III, flattening and possible inversion in aVF, possible biphasic T wave in V6, compared to prior there is no significant change  Assessment/Plan Active Problems:   Syncope   AKI (acute kidney injury) ([place])   Elevated troponin   Scott Le is a 70 y.o. male with hx of osteoarthritis, hyperlipidemia, who presents after an episode of syncope.  #Syncope Strong suspicion that patient had an episode of vagal syncope secondary to musculoskeletal back spasm.  History appears consistent with prodromal event, low suspicion for cardiac arrhythmia as the etiology.  In addition patient has recently increased his physical activity and trips to the gym which would be likely because of muscle spasm.  No evidence of new compression fractures on CT likely.  In addition patient appears to be dehydrated with mild AKI which also would have contributed to his vagal response. - Monitor on telemetry - Echocardiogram in the a.m. - Orthostatic vitals  #Elevated troponin #Abnormal ECG Barely elevated troponin, suspect it is related to hypotension during vagal event as well as effect of his AKI.  On review of prior EKGs he appears to have a normal variant lead III T wave inversion, remainder of changes have had no progression since last EKG in 2017. - Work-up as above  #AKI Likely mild prerenal AKI in setting of hard yard work and increased physical activity in the past week.  IV fluids, trend BMP.  #Abnormal imaging findings Will need outpatient follow up for several incidental findings on CT scan. -Obtain thyroid ultrasound as outpatient for 2.2 cm right thyroid nodule - Obtain MRI with liver protocol to further evaluate lesion seen on CT scan  #Elevated blood pressure without diagnosis of hypertension Technically within normal limits per JNC 8.  Follow-up as an outpatient for  consideration of antihypertensive therapy.  Code Status: Full code DVT Prophylaxis: Early ambulation Family Communication: Wife updated at bedside Disposition Plan: Observation, MedSurg  Time spent: 77 min  Clarnce Flock MD/[facility]  Note:  This document was prepared using Systems analyst and may include unintentional dictation errors.

## 2021-02-08 DIAGNOSIS — R55 Syncope and collapse: Secondary | ICD-10-CM | POA: Diagnosis not present

## 2021-02-08 DIAGNOSIS — N179 Acute kidney failure, unspecified: Secondary | ICD-10-CM | POA: Diagnosis not present

## 2021-02-08 LAB — BASIC METABOLIC PANEL
Anion gap: 5 (ref 5–15)
BUN: 22 mg/dL (ref 8–23)
CO2: 24 mmol/L (ref 22–32)
Calcium: 8.6 mg/dL — ABNORMAL LOW (ref 8.9–10.3)
Chloride: 110 mmol/L (ref 98–111)
Creatinine, Ser: 1.02 mg/dL (ref 0.61–1.24)
GFR, Estimated: >60 mL/min (ref >60)
Glucose, Bld: 110 mg/dL — ABNORMAL HIGH (ref 70–99)
Potassium: 3.9 mmol/L (ref 3.5–5.1)
Sodium: 139 mmol/L (ref 135–145)

## 2021-02-08 LAB — SARS CORONAVIRUS 2 (TAT 6-24 HRS): SARS Coronavirus 2: NEGATIVE

## 2021-02-08 LAB — TROPONIN I (HIGH SENSITIVITY): Troponin I (High Sensitivity): 9 ng/L (ref <18)

## 2021-02-08 NOTE — Discharge Summary (Signed)
[facility] Discharge Summary   Patient: Scott Le [id number]  PCP: Birdie Sons, MD  Date of admission: 02/07/2021   Date of discharge:  02/08/2021     Discharge Diagnoses:  Principal diagnosis Syncope Active Problems:   Syncope   AKI (acute kidney injury) ([place])   Elevated troponin   Admitted From: Home Disposition:  Home   Recommendations for Outpatient Follow-up:  PCP: in 1 wk Follow up LABS/TEST: 2D echocardiogram, thyroid ultrasound and MRI liver   Diet recommendation: Regular diet  Activity: The patient is advised to gradually reintroduce usual activities, as tolerated  Discharge Condition: stable  Code Status: Full code   History of present illness: As per the H and P dictated on admission  Hospital Course:   Scott Le is a 70 y.o. male with hx of osteoarthritis, hyperlipidemia, who presents after an episode of syncope.  Patient had sudden onset right-sided back pain during taking shower after working in the backyard which lasted for few minutes and patient was feeling blacked out.  Pain was severe, EMS was called and patient was brought into the ED, pain resolved after Dilaudid.  Imaging showed chronic T11 and L3 fracture.  In the morning time patient was completely pain-free.  Denied any symptoms. In the ED initial vital signs notable only for mild hypertension.  Lab work-up showed normal CBC, CMP with creatinine of 1.4 (baseline appears to be 0.9-1.0) but otherwise unremarkable, lactic acid normal.  Troponin was barely elevated at 18, EKG showed sinus rhythm with T wave inversion in lead III, flattening and possible inversion in aVF, possible biphasic T wave in V6, compared to prior there is no significant change.  CTA of chest abdomen and pelvis showed no acute findings, and old T11 compression fracture was seen as well as several incidental findings.  He was given full dose aspirin, 2 L normal saline bolus, and admitted for  observation. #Syncope most likely vasovagal due to excessive pain, possible muscle spasm.  No acute fracture of the spine.  Asymptomatic and stable to be discharged. #Mildly elevated troponin could be secondary to dehydration AKI and vasovagal syncope.  Patient denied any chest pain.  Echocardiogram pending but patient wanted to be discharged and follow-up as an outpatient.  Patient was recommended to get echocardiogram done as an outpatient. #AKI, most likely due to dehydration.  Patient's renal function improved after IV fluid given.  Advised to continue oral hydration. Abnormal imaging findings Will need outpatient follow up for several incidental findings on CT scan. -Obtain thyroid ultrasound as outpatient for 2.2 cm right thyroid nodule - Obtain MRI with liver protocol to further evaluate lesion seen on CT scan  Body mass index is 26.46 kg/m.  Nutrition Interventions:   Patient was ambulatory without any assistance. On the day of the discharge the patient's vitals were stable, and no other acute medical condition were reported by patient. the patient was felt safe to be discharge at Home .  Consultants: None Procedures: none  Discharge Exam: General: Appear in no distress, no Rash; Oral Mucosa Clear, moist. Cardiovascular: S1 and S2 Present, no Murmur, Respiratory: normal respiratory effort, Bilateral Air entry present and no Crackles, no wheezes Abdomen: Bowel Sound present, Soft and no tenderness, no hernia Extremities: no Pedal edema, no calf tenderness Neurology: alert and oriented to time, place, and person affect appropriate.  Filed Weights   02/07/21 1421  Weight: 78.9 kg   Vitals:   02/08/21 0426 02/08/21 0804  BP: 122/79 116/73  Pulse: (!) 57 (!) 58  Resp: 14 18  Temp: 98.6 F (37 C) 98.1 F (36.7 C)  SpO2: 100% 99%    DISCHARGE MEDICATION: Allergies as of 02/08/2021       Reactions   Celecoxib    Other reaction(s): Dry Mouth   Ciprofloxacin Other (See  Comments)   Codeine Nausea And Vomiting   Levaquin [levofloxacin] Other (See Comments)   Joint pain   Nsaids Other (See Comments)   Bleeding gums/gum issues        Medication List     STOP taking these medications    ketoconazole 2 % cream Commonly known as: NIZORAL       TAKE these medications    acetaminophen 500 MG tablet Commonly known as: TYLENOL Take 500 mg by mouth daily as needed.   multivitamin tablet Take 1 tablet by mouth daily. Occasionally   mupirocin ointment 2 % Commonly known as: BACTROBAN Apply 1 application topically daily. Qd to excision site with dressing changes   vitamin C 500 MG tablet Commonly known as: ASCORBIC ACID Take 500 mg by mouth daily.   Vitamin D 125 MCG (5000 UT) Caps Take 125 mcg by mouth daily.   zinc gluconate 50 MG tablet Take 50 mg by mouth daily.       Allergies  Allergen Reactions   Celecoxib     Other reaction(s): Dry Mouth   Ciprofloxacin Other (See Comments)   Codeine Nausea And Vomiting   Levaquin [Levofloxacin] Other (See Comments)    Joint pain   Nsaids Other (See Comments)    Bleeding gums/gum issues   Discharge Instructions     Call MD for:  difficulty breathing, headache or visual disturbances   Complete by: As directed    Call MD for:  extreme fatigue   Complete by: As directed    Call MD for:  persistant dizziness or light-headedness   Complete by: As directed    Call MD for:  persistant nausea and vomiting   Complete by: As directed    Call MD for:  severe uncontrolled pain   Complete by: As directed    Call MD for:  temperature >100.4   Complete by: As directed    Diet - low sodium heart healthy   Complete by: As directed    Discharge instructions   Complete by: As directed    Follow-up with PCP in 1 week Patient will need to be echocardiogram as an outpatient, thyroid sonogram for right thyroid nodule and MRI liver for right lower lobe lesion 7 mm possible hemangioma.   Increase  activity slowly   Complete by: As directed        The results of significant diagnostics from this hospitalization (including imaging, microbiology, ancillary and laboratory) are listed below for reference.    Significant Diagnostic Studies: CT Angio Chest/Abd/Pel for Dissection W and/or Wo Contrast  Result Date: 02/07/2021 CLINICAL DATA:  Back pain after working in the yard, passed out in shower. EXAM: CT ANGIOGRAPHY CHEST, ABDOMEN AND PELVIS TECHNIQUE: Non-contrast CT of the chest was initially obtained. Multidetector CT imaging through the chest, abdomen and pelvis was performed using the standard protocol during bolus administration of intravenous contrast. Multiplanar reconstructed images and MIPs were obtained and reviewed to evaluate the vascular anatomy. CONTRAST:  112m OMNIPAQUE IOHEXOL 350 MG/ML SOLN COMPARISON:  None. FINDINGS: CTA CHEST FINDINGS Cardiovascular: Noncontrast sequence demonstrates scatter aortic atherosclerosis without findings of intramural hematoma. Preferential opacification of the thoracic aorta. No evidence of  thoracic aortic aneurysm or dissection. No central pulmonary embolus. Normal caliber central pulmonary arteries. Normal heart size. No pericardial effusion. Mediastinum/Nodes: Heterogeneous 2.2 cm right thyroid nodule. No pathologically enlarged mediastinal, hilar or axillary lymph nodes. The trachea and esophagus are unremarkable. Lungs/Pleura: Bibasilar atelectasis. No suspicious pulmonary nodules or masses. No focal airspace consolidation. No pleural effusion. No pneumothorax. Musculoskeletal: Multilevel degenerative changes spine. Age indeterminate superior endplate compression deformity at T11 with approximately 40% height loss. No aggressive lytic or blastic lesion of bone. Surgical anchors in the right humeral head. Severe degenerative change of the left glenohumeral joint. Degenerative change of the right glenohumeral joint. Review of the MIP images confirms  the above findings. CTA ABDOMEN AND PELVIS FINDINGS VASCULAR Aorta: Aortic atherosclerosis. Normal caliber aorta without aneurysm, dissection, vasculitis or significant stenosis. Celiac: Patent without evidence of aneurysm, dissection, vasculitis or significant stenosis. SMA: Patent without evidence of aneurysm, dissection, vasculitis or significant stenosis. Renals: Both renal arteries are patent without evidence of aneurysm, dissection, vasculitis, fibromuscular dysplasia or significant stenosis. IMA: Patent without evidence of aneurysm, dissection, vasculitis or significant stenosis. Inflow: Patent without evidence of aneurysm, dissection, vasculitis or significant stenosis. Veins: No obvious venous abnormality within the limitations of this arterial phase study. Review of the MIP images confirms the above findings. NON-VASCULAR Hepatobiliary: Hyperenhancing 7 mm focus in the right lobe of the liver on image 93/5, incompletely evaluated but likely reflecting a flash filling hemangioma. Hepatic parenchyma is otherwise unremarkable without fissural widening or contour nodularity. Gallbladder is unremarkable. No biliary ductal dilation. Pancreas: No pancreatic ductal dilatation or surrounding inflammatory changes. Spleen: No splenic injury or perisplenic hematoma. Adrenals/Urinary Tract: Bilateral adrenal glands are unremarkable. No hydronephrosis. Scarring in the upper and lower pole the left kidney. Urinary bladder is unremarkable for degree of distension. Stomach/Bowel: Small hiatal hernia otherwise the stomach is unremarkable. No pathologic dilation of small or large bowel. The appendix and terminal ileum appear normal. Small volume of formed stool throughout the colon. Scattered colonic diverticula without findings of acute diverticulosis. No evidence of acute bowel inflammation. Lymphatic: No pathologically enlarged abdominal or pelvic lymph nodes. Reproductive: Prostate is unremarkable. Other: No  abdominopelvic ascites. Musculoskeletal: Levoconvex curvature of the lumbar spine. Multilevel degenerative change of the lumbar spine. Mild anterior wedging of the L3 vertebral body with approximately 25% height loss, technically age indeterminate but appearing remote. Grade 1 degenerative L5 on S1 anterolisthesis. Degenerative changes bilateral hips. No acute osseous abnormality. Review of the MIP images confirms the above findings. IMPRESSION: 1. No evidence of aortic aneurysm or dissection. 2. Superior endplate compression deformity at T11 with approximately 40% height loss and L3 vertebral body with approximately 25% height loss, technically age indeterminate but remote in appearance remote. Recommend correlation with point tenderness to assess acuity. 3. Heterogeneous 2.2 cm right thyroid nodule. Recommend thyroid US (ref: J Am Coll Radiol. 2015 Feb;12(2): 143-50). 4. Hyperenhancing 7 mm focus in the right lobe of the liver on image 93/5, incompletely evaluated but in a non cirrhotic liver this likely refle reflects cting a flash filling hemangioma. More definitive characterization could be obtained with nonemergent liver protocol abdominal MRI with and without contrast if clinically indicated. 5. Colonic diverticulosis without findings of acute diverticulosis. 6. Small hiatal hernia. 7.  Aortic Atherosclerosis (ICD10-I70.0). Electronically Signed   By: Dahlia Bailiff M.D.   On: 02/07/2021 17:26    Microbiology: Recent Results (from the past 240 hour(s))  SARS CORONAVIRUS 2 (TAT 6-24 HRS) Nasopharyngeal Nasopharyngeal Swab     Status: None  Collection Time: 02/07/21  9:19 PM   Specimen: Nasopharyngeal Swab  Result Value Ref Range Status   SARS Coronavirus 2 NEGATIVE NEGATIVE Final    Comment: (NOTE) SARS-CoV-2 target nucleic acids are NOT DETECTED.  The SARS-CoV-2 RNA is generally detectable in upper and lower respiratory specimens during the acute phase of infection. Negative results do not  preclude SARS-CoV-2 infection, do not rule out co-infections with other pathogens, and should not be used as the sole basis for treatment or other patient management decisions. Negative results must be combined with clinical observations, patient history, and epidemiological information. The expected result is Negative.  Fact Sheet for Patients: SugarRoll.be  Fact Sheet for Healthcare Providers: https://www.woods-mathews.com/  This test is not yet approved or cleared by the [place] FDA and  has been authorized for detection and/or diagnosis of SARS-CoV-2 by FDA under an Emergency Use Authorization (EUA). This EUA will remain  in effect (meaning this test can be used) for the duration of the COVID-19 declaration under Se ction 564(b)(1) of the Act, 21 U.S.C. section 360bbb-3(b)(1), unless the authorization is terminated or revoked sooner.  Performed at [facility], [place] [address] [place], [place] [address]      Labs: CBC: Recent Labs  Lab 02/07/21 1431  WBC 7.5  HGB 14.1  HCT 41.3  MCV 85.7  PLT [id number]   Basic Metabolic Panel: Recent Labs  Lab 02/07/21 1431 02/08/21 0439  NA 138 139  K 4.4 3.9  CL 102 110  CO2 28 24  GLUCOSE 102* 110*  BUN 28* 22  CREATININE 1.40* 1.02  CALCIUM 9.8 8.6*   Liver Function Tests: Recent Labs  Lab 02/07/21 1431  AST 28  ALT 20  ALKPHOS 71  BILITOT 1.0  PROT 7.4  ALBUMIN 4.7   No results for input(s): LIPASE, AMYLASE in the last 168 hours. No results for input(s): AMMONIA in the last 168 hours. Cardiac Enzymes: Recent Labs  Lab 02/07/21 1639  CKTOTAL 95   BNP (last 3 results) No results for input(s): BNP in the last 8760 hours. CBG: No results for input(s): GLUCAP in the last 168 hours.  Time spent: 35 minutes  Signed:  Val Riles  [facility]  02/08/2021 12:05 PM

## 2021-02-08 NOTE — Plan of Care (Signed)
VSS, patient alert and oriented.  Discharged to home with wife

## 2021-02-10 ENCOUNTER — Other Ambulatory Visit: Payer: Self-pay

## 2021-02-10 ENCOUNTER — Ambulatory Visit (INDEPENDENT_AMBULATORY_CARE_PROVIDER_SITE_OTHER): Payer: Medicare HMO | Admitting: Family Medicine

## 2021-02-10 ENCOUNTER — Encounter: Payer: Self-pay | Admitting: Family Medicine

## 2021-02-10 VITALS — BP 118/71 | HR 56 | Temp 97.9°F | Resp 16 | Wt 177.0 lb

## 2021-02-10 DIAGNOSIS — S22080S Wedge compression fracture of T11-T12 vertebra, sequela: Secondary | ICD-10-CM

## 2021-02-10 DIAGNOSIS — R55 Syncope and collapse: Secondary | ICD-10-CM

## 2021-02-10 DIAGNOSIS — Z23 Encounter for immunization: Secondary | ICD-10-CM | POA: Diagnosis not present

## 2021-02-10 DIAGNOSIS — M546 Pain in thoracic spine: Secondary | ICD-10-CM

## 2021-02-10 DIAGNOSIS — N170 Acute kidney failure with tubular necrosis: Secondary | ICD-10-CM

## 2021-02-10 DIAGNOSIS — E041 Nontoxic single thyroid nodule: Secondary | ICD-10-CM | POA: Diagnosis not present

## 2021-02-10 DIAGNOSIS — I700 Atherosclerosis of aorta: Secondary | ICD-10-CM | POA: Diagnosis not present

## 2021-02-10 DIAGNOSIS — S22080A Wedge compression fracture of T11-T12 vertebra, initial encounter for closed fracture: Secondary | ICD-10-CM | POA: Insufficient documentation

## 2021-02-10 DIAGNOSIS — G8929 Other chronic pain: Secondary | ICD-10-CM

## 2021-02-10 DIAGNOSIS — K769 Liver disease, unspecified: Secondary | ICD-10-CM | POA: Insufficient documentation

## 2021-02-10 NOTE — Progress Notes (Signed)
Established patient visit   Patient: Scott Le   DOB: [birth year]   70 y.o. Male  MRN: [id number] Visit Date: 02/10/2021  Today's healthcare provider: Lelon Huh, MD   Chief Complaint  Patient presents with   Hospitalization Follow-up   Subjective  -------------------------------------------------------------------------------------------------------------------- HPI  Follow up Hospitalization  Patient was admitted to [facility] on 02/07/2021 and discharged on 02/08/2021. He was treated for syncope and elevated Troponin. Treatment for this included IV fluids. Per discharge note, recommended echocardiogram as an outpatient due to elevated troponin. Discharge note also request outpatient follow up for several incidental findings on CT scan: Obtain thyroid ultrasound as outpatient for 2.2 cm right thyroid nodule - Obtain MRI with liver protocol to further evaluate lesion seen on CT scan. Telephone follow up was not done. He reports good compliance with treatment. He reports this condition is improved. Patient would like a referral to an orthopedic specialist at [facility] for his back pain.   -----------------------------------------------------------------------------------------   Medications: Outpatient Medications Prior to Visit  Medication Sig   acetaminophen (TYLENOL) 500 MG tablet Take 500 mg by mouth daily as needed.   Cholecalciferol (VITAMIN D) 125 MCG (5000 UT) CAPS Take 125 mcg by mouth daily.   Multiple Vitamin (MULTIVITAMIN) tablet Take 1 tablet by mouth daily. Occasionally   vitamin C (ASCORBIC ACID) 500 MG tablet Take 500 mg by mouth daily.   zinc gluconate 50 MG tablet Take 50 mg by mouth daily.   [DISCONTINUED] mupirocin ointment (BACTROBAN) 2 % Apply 1 application topically daily. Qd to excision site with dressing changes (Patient not taking: Reported on 02/10/2021)   No facility-administered medications prior to visit.    Review of Systems  Constitutional:   Negative for appetite change, chills and fever.  Respiratory:  Negative for chest tightness, shortness of breath and wheezing.   Cardiovascular:  Negative for chest pain and palpitations.  Gastrointestinal:  Negative for abdominal pain, nausea and vomiting.  Musculoskeletal:  Positive for back pain.     Objective  -------------------------------------------------------------------------------------------------------------------- BP 118/71 (BP Location: Left Arm, Patient Position: Sitting, Cuff Size: Normal)   Pulse (!) 56   Temp 97.9 F (36.6 C) (Temporal)   Resp 16   Wt 177 lb (80.3 kg)   BMI 26.91 kg/m    Physical Exam   General appearance:  Well developed, well nourished male, cooperative and in no acute distress Head: Normocephalic, without obvious abnormality, atraumatic Respiratory: Respirations even and unlabored, normal respiratory rate Extremities: All extremities are intact.  Skin: Skin color, texture, turgor normal. No rashes seen  Psych: Appropriate mood and affect. Neurologic: Mental status: Alert, oriented to person, place, and time, thought content appropriate.    Assessment & Plan  ----------------------------------------------------------------------------------------------------------------------  1. Syncope, unspecified syncope type  - ECHOCARDIOGRAM COMPLETE; Future Likely orthostasis due to dehydration, may have vasovagal reaction to acute low back pain.   2. Acute kidney injury (AKI) with acute tubular necrosis (ATN) ([facility]) Resolved at time of discharge.   3. Thyroid nodule Incidental finding CTA - US THYROID; Future  4. Aortic atherosclerosis ([place]) Incidental finding on CTA.   5. Closed wedge compression fracture of T11 vertebra, sequela  - Ambulatory referral to Orthopedic Surgery  6. Lesion of liver less than 1 cm in diameter Incidental finding on CTA - MR LIVER W CONTRAST; Future  7. Chronic midline thoracic back pain  - Ambulatory  referral to Orthopedic Surgery  8. Need for influenza vaccination  - Flu Vaccine QUAD High Dose IM (  Fluad)      The entirety of the information documented in the History of Present Illness, Review of Systems and Physical Exam were personally obtained by me. Portions of this information were initially documented by the CMA and reviewed by me for thoroughness and accuracy.   The entirety of the information documented in the History of Present Illness, Review of Systems and Physical Exam were personally obtained by me. Portions of this information were initially documented by the CMA and reviewed by me for thoroughness and accuracy.     Lelon Huh, MD  [facility] [phone] (phone) [phone] (fax)  [place]

## 2021-02-16 NOTE — Addendum Note (Signed)
Addended by: Birdie Sons on: 02/16/2021 08:01 AM   Modules accepted: Orders

## 2021-02-20 ENCOUNTER — Other Ambulatory Visit: Payer: Self-pay

## 2021-02-20 ENCOUNTER — Ambulatory Visit
Admission: RE | Admit: 2021-02-20 | Discharge: 2021-02-20 | Disposition: A | Discharge status: Home / Self Care | Payer: Medicare HMO | Source: Ambulatory Visit | Attending: Family Medicine | Admitting: Family Medicine

## 2021-02-20 DIAGNOSIS — E041 Nontoxic single thyroid nodule: Secondary | ICD-10-CM | POA: Insufficient documentation

## 2021-02-24 ENCOUNTER — Ambulatory Visit
Admission: RE | Admit: 2021-02-24 | Discharge: 2021-02-24 | Disposition: A | Discharge status: Home / Self Care | Payer: Medicare HMO | Source: Ambulatory Visit | Attending: Family Medicine | Admitting: Family Medicine

## 2021-02-24 ENCOUNTER — Other Ambulatory Visit: Payer: Self-pay

## 2021-02-24 DIAGNOSIS — K769 Liver disease, unspecified: Secondary | ICD-10-CM | POA: Diagnosis not present

## 2021-02-24 MED ORDER — GADOBUTROL 1 MMOL/ML IV SOLN
7.5000 mL | Freq: Once | INTRAVENOUS | Status: AC | PRN
  Administered 2021-02-24: 7.5 mL via INTRAVENOUS

## 2021-02-25 ENCOUNTER — Ambulatory Visit
Admission: RE | Admit: 2021-02-25 | Discharge: 2021-02-25 | Disposition: A | Discharge status: Home / Self Care | Payer: Medicare HMO | Source: Ambulatory Visit | Attending: Family Medicine | Admitting: Family Medicine

## 2021-02-25 DIAGNOSIS — E785 Hyperlipidemia, unspecified: Secondary | ICD-10-CM | POA: Insufficient documentation

## 2021-02-25 DIAGNOSIS — R748 Abnormal levels of other serum enzymes: Secondary | ICD-10-CM | POA: Diagnosis not present

## 2021-02-25 DIAGNOSIS — R55 Syncope and collapse: Secondary | ICD-10-CM

## 2021-02-25 DIAGNOSIS — I081 Rheumatic disorders of both mitral and tricuspid valves: Secondary | ICD-10-CM | POA: Insufficient documentation

## 2021-02-25 NOTE — Progress Notes (Signed)
*  PRELIMINARY RESULTS* Echocardiogram 2D Echocardiogram has been performed.  Sherrie Sport 02/25/2021, 11:56 AM

## 2021-02-26 LAB — ECHOCARDIOGRAM COMPLETE
AR max vel: 2.08 cm2
AV Area VTI: 2.05 cm2
AV Area mean vel: 1.8 cm2
AV Mean grad: 3 mmHg
AV Peak grad: 5.2 mmHg
Ao pk vel: 1.15 m/s
Area-P 1/2: 3.53 cm2
S' Lateral: 2.69 cm

## 2021-03-02 ENCOUNTER — Telehealth: Payer: Self-pay

## 2021-03-02 DIAGNOSIS — E041 Nontoxic single thyroid nodule: Secondary | ICD-10-CM

## 2021-03-02 NOTE — Telephone Encounter (Signed)
Pt advised, and agreed to proceed with the referral.   Thanks,   -Mickel Baas

## 2021-03-02 NOTE — Telephone Encounter (Signed)
Scott Sons, MD  [facility] Nurse Thyroid ultrasound shows 3.8 cm nodule that needs to be biopsied. Needs referral to ENT. Please advise and send referral.  MR of liver show benign cyst that needs no treatment or follow up.

## 2021-03-12 ENCOUNTER — Encounter: Payer: Medicare HMO | Admitting: Dermatology

## 2021-03-19 ENCOUNTER — Encounter: Payer: Medicare HMO | Admitting: Dermatology

## 2021-04-02 ENCOUNTER — Other Ambulatory Visit: Payer: Self-pay | Admitting: Otolaryngology

## 2021-04-02 DIAGNOSIS — E041 Nontoxic single thyroid nodule: Secondary | ICD-10-CM

## 2021-04-06 NOTE — Progress Notes (Addendum)
04/07/21 10:12 AM   Scott Le 18-Mar-1951 [id number]  Referring provider:  Birdie Sons, MD [address] [place],   [address] Chief Complaint  Patient presents with   Benign Prostatic Hypertrophy    1year w/IPSS PVR PSA     HPI: Scott Le is a 70 y.o.male with a personal history of BPH with nocturia, spermatocele, grade 3 variocele,and incomplete bladder emptying, who presents today for a 1 year follow-up with IPSS, PVR, DRE, and PSA.   Patient was previously followed by Dr. Jacqlyn Le at [facility].   His most recent PSA was on 10/01/2020 and it was 0.3.   He is doing well today. He reports that he never took Flomax, he felt like he did not need this medicine.   He is interested in testosterone today. He has low energy level and he has to push himself to want to do things . He reports that he does not have an interest in sexual intercourse mainly due to his wife's health issues affecting hers.      IPSS     Row Name 04/07/21 0900         International Prostate Symptom Score   How often have you had the sensation of not emptying your bladder? Not at All     How often have you had to urinate less than every two hours? Less than half the time     How often have you found you stopped and started again several times when you urinated? Not at All     How often have you found it difficult to postpone urination? Less than 1 in 5 times     How often have you had a weak urinary stream? Not at All     How often have you had to strain to start urination? Not at All     How many times did you typically get up at night to urinate? 2 Times     Total IPSS Score 5       Quality of Life due to urinary symptoms   If you were to spend the rest of your life with your urinary condition just the way it is now how would you feel about that? Pleased              Score:  1-7 Mild 8-19 Moderate 20-35 Severe    PMH: Past Medical History:  Diagnosis Date    Arthritis    Cataract    Corrected   Seasonal allergies    Shingles    Soft tissue injury     Surgical History: Past Surgical History:  Procedure Laterality Date   EYE SURGERY     Cataracts    ROTATOR CUFF REPAIR Right    VASECTOMY      Home Medications:  Allergies as of 04/07/2021       Reactions   Celecoxib    Other reaction(s): Dry Mouth   Ciprofloxacin Other (See Comments)   Codeine Nausea And Vomiting   Levaquin [levofloxacin] Other (See Comments)   Joint pain   Nsaids Other (See Comments)   Bleeding gums/gum issues        Medication List        Accurate as of April 07, 2021 10:12 AM. If you have any questions, ask your nurse or doctor.          acetaminophen 500 MG tablet Commonly known as: TYLENOL Take 500 mg by mouth daily as needed.  multivitamin tablet Take 1 tablet by mouth daily. Occasionally   vitamin C 500 MG tablet Commonly known as: ASCORBIC ACID Take 500 mg by mouth daily.   Vitamin D 125 MCG (5000 UT) Caps Take 125 mcg by mouth daily.   zinc gluconate 50 MG tablet Take 50 mg by mouth daily.        Allergies:  Allergies  Allergen Reactions   Celecoxib     Other reaction(s): Dry Mouth   Ciprofloxacin Other (See Comments)   Codeine Nausea And Vomiting   Levaquin [Levofloxacin] Other (See Comments)    Joint pain   Nsaids Other (See Comments)    Bleeding gums/gum issues    Family History: Family History  Problem Relation Age of Onset   CAD Father 45       s/p post first MI @ age 30, passed from MI @ age 67   CAD Mother 15       first MI @ age 7, s/p multiple stents   Valvular heart disease Mother        s/p TAVR    Social History:  reports that he has never smoked. He has never used smokeless tobacco. He reports that he does not drink alcohol and does not use drugs.   Physical Exam: BP (!) 143/80   Pulse 60   Ht 5\' 8"  (1.727 m)   Wt 174 lb (78.9 kg)   BMI 26.46 kg/m   Constitutional:  Alert and  oriented, No acute distress. HEENT: [place] AT, moist mucus membranes.  Trachea midline, no masses. Cardiovascular: No clubbing, cyanosis, or edema. Respiratory: Normal respiratory effort, no increased work of breathing. Rectal: Normal sphincter tone,  40  CC prostate, smooth no nodules. Prostate asymmetric L>R  Skin: No rashes, bruises or suspicious lesions. Neurologic: Grossly intact, no focal deficits, moving all 4 extremities. Psychiatric: Normal mood and affect.  Laboratory Data:  Lab Results  Component Value Date   CREATININE 1.02 02/08/2021     Lab Results  Component Value Date   HGBA1C 4.9 07/29/2015     Pertinent Imaging: Results for orders placed or performed in visit on 04/07/21  BLADDER SCAN AMB NON-IMAGING  Result Value Ref Range   Scan Result 83ml      Assessment & Plan:   BPH  - IPSS score; 5, mild.  - He is not interested in medicine or surgical intervention at this time  - Continue behavioral modification   2. Prostate asymmetry  - On exam prostate was asymmetric L>R. This has been documented by previous urologist and I will continue to monitor closely especially if he elects to pursue treatment for low testosterone pending lab results - PSA stable  - PSA; future   3. Screening for hypogonadism /fatigue/low libido - He has been experiencing ow libido and feeling tired  - Blood work; pending  Follow-up with blood work results otherwise annually with IPSS/PVR/PSA/DRE  Scott Le,acting as a scribe for Hollice Espy, MD.,have documented all relevant documentation on the behalf of Hollice Espy, MD,as directed by  Hollice Espy, MD while in the presence of Hollice Espy, MD.  I have reviewed the above documentation for accuracy and completeness, and I agree with the above.   Hollice Espy, MD   [facility] [address] [place], [place] [address] [phone]

## 2021-04-07 ENCOUNTER — Ambulatory Visit: Payer: Medicare HMO | Admitting: Urology

## 2021-04-07 ENCOUNTER — Other Ambulatory Visit: Payer: Self-pay

## 2021-04-07 ENCOUNTER — Encounter: Payer: Self-pay | Admitting: Urology

## 2021-04-07 VITALS — BP 143/80 | HR 60 | Ht 68.0 in | Wt 174.0 lb

## 2021-04-07 DIAGNOSIS — N400 Enlarged prostate without lower urinary tract symptoms: Secondary | ICD-10-CM | POA: Diagnosis not present

## 2021-04-07 LAB — BLADDER SCAN AMB NON-IMAGING

## 2021-04-08 ENCOUNTER — Telehealth: Payer: Self-pay | Admitting: *Deleted

## 2021-04-08 LAB — TESTOSTERONE: Testosterone: 427 ng/dL (ref 264–916)

## 2021-04-08 NOTE — Telephone Encounter (Addendum)
Left patient VM with details, asked to return call with any questions.  ----- Message from Hollice Espy, MD sent at 04/08/2021  8:09 AM EDT ----- Testosterone levels are normal.  Hollice Espy, MD

## 2021-04-15 ENCOUNTER — Ambulatory Visit
Admission: RE | Admit: 2021-04-15 | Discharge: 2021-04-15 | Disposition: A | Discharge status: Home / Self Care | Payer: Medicare HMO | Source: Ambulatory Visit | Attending: Otolaryngology | Admitting: Otolaryngology

## 2021-04-15 DIAGNOSIS — E041 Nontoxic single thyroid nodule: Secondary | ICD-10-CM | POA: Insufficient documentation

## 2021-04-15 NOTE — Procedures (Signed)
Successful US guided FNA of right superior thyroid nodule No complications. See PACS for full report.  Tsosie Billing D, PA-C 04/15/2021, 2:02 PM

## 2021-04-16 ENCOUNTER — Encounter: Payer: Self-pay | Admitting: Urology

## 2021-05-05 ENCOUNTER — Encounter: Payer: Self-pay | Admitting: Otolaryngology

## 2021-05-05 LAB — CYTOLOGY - NON PAP

## 2021-05-06 ENCOUNTER — Other Ambulatory Visit: Payer: Self-pay

## 2021-05-06 ENCOUNTER — Ambulatory Visit: Payer: Medicare HMO | Admitting: Dermatology

## 2021-05-06 DIAGNOSIS — D179 Benign lipomatous neoplasm, unspecified: Secondary | ICD-10-CM

## 2021-05-06 DIAGNOSIS — L821 Other seborrheic keratosis: Secondary | ICD-10-CM

## 2021-05-06 DIAGNOSIS — B079 Viral wart, unspecified: Secondary | ICD-10-CM | POA: Diagnosis not present

## 2021-05-06 DIAGNOSIS — Z1283 Encounter for screening for malignant neoplasm of skin: Secondary | ICD-10-CM | POA: Diagnosis not present

## 2021-05-06 DIAGNOSIS — L570 Actinic keratosis: Secondary | ICD-10-CM

## 2021-05-06 DIAGNOSIS — L578 Other skin changes due to chronic exposure to nonionizing radiation: Secondary | ICD-10-CM

## 2021-05-06 DIAGNOSIS — D229 Melanocytic nevi, unspecified: Secondary | ICD-10-CM

## 2021-05-06 DIAGNOSIS — L814 Other melanin hyperpigmentation: Secondary | ICD-10-CM

## 2021-05-06 DIAGNOSIS — D1800 Hemangioma unspecified site: Secondary | ICD-10-CM

## 2021-05-06 DIAGNOSIS — L738 Other specified follicular disorders: Secondary | ICD-10-CM

## 2021-05-06 NOTE — Patient Instructions (Addendum)

## 2021-05-06 NOTE — Progress Notes (Signed)
Follow-Up Visit   Subjective  Scott Le is a 70 y.o. male who presents for the following: Annual Exam (Patient has noticed a lump on the lower back that can be bothersome since he lies on it. He has also noticed a wart on the R heel that he would like treated today.). The patient presents for Total-Body Skin Exam (TBSE) for skin cancer screening and mole check.The patient has spots, moles and lesions to be evaluated, some may be new or changing and the patient has concerns that these could be cancer.   The following portions of the chart were reviewed this encounter and updated as appropriate:   Tobacco  Allergies  Meds  Problems  Med Hx  Surg Hx  Fam Hx     Review of Systems:  No other skin or systemic complaints except as noted in HPI or Assessment and Plan.  Objective  Well appearing patient in no apparent distress; mood and affect are within normal limits.  A full examination was performed including scalp, head, eyes, ears, nose, lips, neck, chest, axillae, abdomen, back, buttocks, bilateral upper extremities, bilateral lower extremities, hands, feet, fingers, toes, fingernails, and toenails. All findings within normal limits unless otherwise noted below.  Back Rubbery nodule.  Right Ear Erythematous thin papules/macules with gritty scale.   R heel x 1 Verrucous papules -- Discussed viral etiology and contagion.    Assessment & Plan  Lipoma, unspecified site Back Discussed surgical option if bothersome.   AK (actinic keratosis) Right Ear Destruction of lesion - Right Ear Complexity: simple   Destruction method: cryotherapy   Informed consent: discussed and consent obtained   Timeout:  patient name, date of birth, surgical site, and procedure verified Lesion destroyed using liquid nitrogen: Yes   Region frozen until ice ball extended beyond lesion: Yes   Outcome: patient tolerated procedure well with no complications   Post-procedure details: wound care  instructions given    Viral warts, unspecified type R heel x 1 Discussed viral etiology and risk of spread.  Discussed multiple treatments may be required to clear warts.  Discussed possible post-treatment dyspigmentation and risk of recurrence.  Destruction of lesion - R heel x 1 Complexity: simple   Destruction method: cryotherapy   Informed consent: discussed and consent obtained   Timeout:  patient name, date of birth, surgical site, and procedure verified Lesion destroyed using liquid nitrogen: Yes   Region frozen until ice ball extended beyond lesion: Yes   Outcome: patient tolerated procedure well with no complications   Post-procedure details: wound care instructions given    Destruction of lesion - R heel x 1 Complexity: simple   Destruction method: chemical removal   Chemical destruction method: cantharidin   Chemical destruction method comment:  Canthacur plus Application time:  4 hours Procedure instructions: patient instructed to wash and dry area   Outcome: patient tolerated procedure well with no complications    Skin cancer screening  Lentigines - Scattered tan macules - Due to sun exposure - Benign-appearing, observe - Recommend daily broad spectrum sunscreen SPF 30+ to sun-exposed areas, reapply every 2 hours as needed. - Call for any changes  Seborrheic Keratoses - Stuck-on, waxy, tan-brown papules and/or plaques  - Benign-appearing - Discussed benign etiology and prognosis. - Observe - Call for any changes  Melanocytic Nevi - Tan-brown and/or pink-flesh-colored symmetric macules and papules - Benign appearing on exam today - Observation - Call clinic for new or changing moles - Recommend daily use of  broad spectrum spf 30+ sunscreen to sun-exposed areas.   Hemangiomas - Red papules - Discussed benign nature - Observe - Call for any changes  Actinic Damage - Chronic condition, secondary to cumulative UV/sun exposure - diffuse scaly  erythematous macules with underlying dyspigmentation - Recommend daily broad spectrum sunscreen SPF 30+ to sun-exposed areas, reapply every 2 hours as needed.  - Staying in the shade or wearing long sleeves, sun glasses (UVA+UVB protection) and wide brim hats (4-inch brim around the entire circumference of the hat) are also recommended for sun protection.  - Call for new or changing lesions.  Sebaceous Hyperplasia - L med forehead - Small yellow papules with a central dell - Benign - Observe  Skin cancer screening performed today.  Return in about 1 year (around 05/06/2022) for TBSE.  Luther Redo, CMA, am acting as scribe for Sarina Ser, MD . Documentation: I have reviewed the above documentation for accuracy and completeness, and I agree with the above.  Sarina Ser, MD

## 2021-05-14 ENCOUNTER — Encounter: Payer: Self-pay | Admitting: Dermatology

## 2021-07-21 ENCOUNTER — Ambulatory Visit: Payer: Medicare HMO | Admitting: Physician Assistant

## 2021-07-21 ENCOUNTER — Encounter: Payer: Self-pay | Admitting: Physician Assistant

## 2021-07-21 ENCOUNTER — Other Ambulatory Visit: Payer: Self-pay

## 2021-07-21 VITALS — BP 142/90 | HR 67 | Temp 97.7°F | Resp 16 | Ht 66.0 in | Wt 177.0 lb

## 2021-07-21 DIAGNOSIS — R030 Elevated blood-pressure reading, without diagnosis of hypertension: Secondary | ICD-10-CM | POA: Diagnosis not present

## 2021-07-21 DIAGNOSIS — Z0189 Encounter for other specified special examinations: Secondary | ICD-10-CM

## 2021-07-21 NOTE — Progress Notes (Signed)
Established patient visit   Patient: Scott Le   DOB: 05/27/1951   71 y.o. Male  MRN: [id number] Visit Date: 07/21/2021  Today's healthcare provider: Dani Gobble Sephira Zellman, PA-C  Introduced myself to the patient as a Journalist, newspaper and provided education on APPs in clinical practice.    I,Joseline E Rosas,acting as a scribe for [employer], PA-C.,have documented all relevant documentation on the behalf of [place], PA-C,as directed by  [employer], PA-C while in the presence of Abisai Deer E Khalib Fendley, PA-C.   Chief Complaint  Patient presents with   Hypertension   Subjective    Hypertension This is a new problem. The current episode started in the past 7 days (At the orhopetics appointment his BP 170/95 and at home yesterday 144/90). The problem is unchanged. Associated symptoms include headaches (dull pain on Sunday). Pertinent negatives include no anxiety, blurred vision, chest pain, malaise/fatigue, neck pain, palpitations, peripheral edema, shortness of breath or sweats. There are no associated agents to hypertension.     States he has not taken the Meloxicam the whole month of Jan but has recently restarted taking it  He is now taking Meloxicam and Tylenol since the start of Feb- elevated BP at ortho visit earlier this month States Sun he had what felt like a sinus headache and thinks it may be BP related States he has been trying to gain weight for a planned upcoming through hike in the [employer] he has been eating food that may have increased sodium.  He states he is very active and has a history of endurance training- walks several miles per day, does elliptical and treadmill.      Medications: Outpatient Medications Prior to Visit  Medication Sig   acetaminophen (TYLENOL) 500 MG tablet Take 500 mg by mouth daily as needed.   Cholecalciferol (VITAMIN D) 125 MCG (5000 UT) CAPS Take 125 mcg by mouth daily.   Multiple Vitamin (MULTIVITAMIN) tablet Take 1 tablet by mouth  daily. Occasionally   vitamin C (ASCORBIC ACID) 500 MG tablet Take 500 mg by mouth daily.   zinc gluconate 50 MG tablet Take 50 mg by mouth daily.   No facility-administered medications prior to visit.    Review of Systems  Constitutional:  Negative for malaise/fatigue and unexpected weight change.  HENT:  Negative for tinnitus.   Eyes:  Negative for blurred vision and visual disturbance.  Respiratory:  Negative for shortness of breath.   Cardiovascular:  Negative for chest pain and palpitations.  Musculoskeletal:  Negative for neck pain.  Neurological:  Positive for headaches (dull pain on Sunday).      Objective    BP (!) 142/90 (BP Location: Right Arm, Patient Position: Sitting, Cuff Size: Large) Comment: manual   Pulse 67    Temp 97.7 F (36.5 C) (Oral)    Resp 16    Ht 5\' 6"  (1.676 m)    Wt 177 lb (80.3 kg)    BMI 28.57 kg/m  {Show previous vital signs (optional):23777}  Physical Exam Vitals reviewed.  Constitutional:      General: He is awake.     Appearance: Normal appearance. He is well-developed, well-groomed and normal weight.  HENT:     Head: Normocephalic and atraumatic.  Cardiovascular:     Rate and Rhythm: Normal rate and regular rhythm.     Pulses: Normal pulses.     Heart sounds: Normal heart sounds, S1 normal and S2 normal.  Pulmonary:  Effort: Pulmonary effort is normal.     Breath sounds: Normal breath sounds. No wheezing, rhonchi or rales.  Musculoskeletal:     Cervical back: Normal range of motion and neck supple.     Right lower leg: No edema.     Left lower leg: No edema.  Skin:    General: Skin is warm.     Capillary Refill: Capillary refill takes less than 2 seconds.  Neurological:     Mental Status: He is alert.  Psychiatric:        Behavior: Behavior is cooperative.      No results found for any visits on 07/21/21.  Assessment & Plan      Problem List Items Addressed This Visit   None Visit Diagnoses     Elevated blood  pressure reading without diagnosis of hypertension    -  Primary New problem, recently noted at several apts Unsure if this represents transient concern vs newly developing HTN Recommend recording BP measures at home until next follow up to determine if this is extended concern vs potential white coat/ situational elevations Patient is still extremely active on a daily basis and recommend continuing with this practice Discussed his recent efforts to gain weight for [place] hike as potential cause for increased BP as well as response to pain after not using meloxicam for a month Provided information regarding potential medications that would likely be appropriate for him should this prove to be HTN  Recommend initiating ARB if needed Follow up in approx 3 weeks to discuss further     Routine lab draw       Relevant Orders   Comprehensive Metabolic Panel (CMET)   CBC w/Diff/Platelet   Lipid Profile   TSH        Return in about 3 weeks (around 08/11/2021) for elevated BP.   I, Envy Meno E Lyden Redner, PA-C, have reviewed all documentation for this visit. The documentation on 07/21/21 for the exam, diagnosis, procedures, and orders are all accurate and complete.   Logan Baltimore, Glennie Isle MPH [place], PA-C  [facility] [phone] (phone) [phone] (fax)  [place]

## 2021-07-21 NOTE — Patient Instructions (Addendum)
Based on what we discussed today I think we should do the following:  Take your blood pressure daily, you can take it at different times throughout the day but please record it and bring those records with you to your follow up in about 3 weeks  I have included some information about one of the medications I could potentially start for high blood pressure if it is indicated.   Continue to exercise and stay active as this is extremely good for your heart  I will update you with lab results once they are available and discuss if you should continue with the Meloxicam   Introduced myself to the patient as a PA-C and provided education on APPs in clinical practice.

## 2021-07-22 LAB — COMPREHENSIVE METABOLIC PANEL
ALT: 25 IU/L (ref 0–44)
AST: 27 IU/L (ref 0–40)
Albumin/Globulin Ratio: 2.1 (ref 1.2–2.2)
Albumin: 4.8 g/dL — ABNORMAL HIGH (ref 3.7–4.7)
Alkaline Phosphatase: 98 IU/L (ref 44–121)
BUN/Creatinine Ratio: 19 (ref 10–24)
BUN: 22 mg/dL (ref 8–27)
Bilirubin Total: 0.5 mg/dL (ref 0.0–1.2)
CO2: 26 mmol/L (ref 20–29)
Calcium: 9.7 mg/dL (ref 8.6–10.2)
Chloride: 100 mmol/L (ref 96–106)
Creatinine, Ser: 1.14 mg/dL (ref 0.76–1.27)
Globulin, Total: 2.3 g/dL (ref 1.5–4.5)
Glucose: 97 mg/dL (ref 70–99)
Potassium: 4.5 mmol/L (ref 3.5–5.2)
Sodium: 139 mmol/L (ref 134–144)
Total Protein: 7.1 g/dL (ref 6.0–8.5)
eGFR: 69 mL/min/{1.73_m2} (ref >59)

## 2021-07-22 LAB — CBC WITH DIFFERENTIAL/PLATELET
Basophils Absolute: 0.1 10*3/uL (ref 0.0–0.2)
Basos: 1 %
EOS (ABSOLUTE): 0.1 10*3/uL (ref 0.0–0.4)
Eos: 1 %
Hematocrit: 44.1 % (ref 37.5–51.0)
Hemoglobin: 14.6 g/dL (ref 13.0–17.7)
Immature Grans (Abs): 0 10*3/uL (ref 0.0–0.1)
Immature Granulocytes: 0 %
Lymphocytes Absolute: 2 10*3/uL (ref 0.7–3.1)
Lymphs: 37 %
MCH: 27.5 pg (ref 26.6–33.0)
MCHC: 33.1 g/dL (ref 31.5–35.7)
MCV: 83 fL (ref 79–97)
Monocytes Absolute: 0.6 10*3/uL (ref 0.1–0.9)
Monocytes: 11 %
Neutrophils Absolute: 2.6 10*3/uL (ref 1.4–7.0)
Neutrophils: 50 %
Platelets: 163 10*3/uL (ref 150–450)
RBC: 5.31 x10E6/uL (ref 4.14–5.80)
RDW: 14.3 % (ref 11.6–15.4)
WBC: 5.3 10*3/uL (ref 3.4–10.8)

## 2021-07-22 LAB — LIPID PANEL
Chol/HDL Ratio: 4 ratio (ref 0.0–5.0)
Cholesterol, Total: 203 mg/dL — ABNORMAL HIGH (ref 100–199)
HDL: 51 mg/dL (ref >39)
LDL Chol Calc (NIH): 135 mg/dL — ABNORMAL HIGH (ref 0–99)
Triglycerides: 92 mg/dL (ref 0–149)
VLDL Cholesterol Cal: 17 mg/dL (ref 5–40)

## 2021-07-22 LAB — TSH: TSH: 1.97 u[IU]/mL (ref 0.450–4.500)

## 2021-08-11 ENCOUNTER — Encounter: Payer: Self-pay | Admitting: Physician Assistant

## 2021-08-11 ENCOUNTER — Ambulatory Visit: Payer: Medicare HMO | Admitting: Physician Assistant

## 2021-08-11 ENCOUNTER — Other Ambulatory Visit: Payer: Self-pay

## 2021-08-11 VITALS — BP 138/82 | HR 55 | Temp 97.4°F | Resp 16 | Wt 174.6 lb

## 2021-08-11 DIAGNOSIS — E785 Hyperlipidemia, unspecified: Secondary | ICD-10-CM

## 2021-08-11 DIAGNOSIS — R030 Elevated blood-pressure reading, without diagnosis of hypertension: Secondary | ICD-10-CM

## 2021-08-11 NOTE — Assessment & Plan Note (Addendum)
Discussed cholesterol- would like to recheck in 3 months as he continues to return to normal diet. ?Suspect last results were elevated due to his efforts to increase weight for planned extended hike rather than sustained elevation.  ?Recheck in 3 months, results to dictate need for intervention at that time ?Patient is very active and appears to have overall heart healthy diet when eating normally so I do not want to start intervention due to transient diet changes.  ?

## 2021-08-11 NOTE — Progress Notes (Signed)
?  ? ? ?Established patient visit ? ? ?Patient: Scott Le   DOB: 05/01/1951   71 y.o. Male  MRN: [id number] ?Visit Date: 08/11/2021 ? ?Today's healthcare provider: Dani Gobble Carrick Rijos, PA-C  ?Introduced myself to the patient as a Journalist, newspaper and provided education on APPs in clinical practice.  ? ? ?I,Joseline E Rosas,acting as a scribe for [employer], PA-C.,have documented all relevant documentation on the behalf of [place], PA-C,as directed by  [employer], PA-C while in the presence of Arlenne Kimbley E Yekaterina Escutia, PA-C.  ? ?Chief Complaint  ?Patient presents with  ? Follow-up  ?  BP  ? ?Subjective  ?  ?HPI ?HPI   ? ? Follow-up   ? Additional comments: BP ? ?  ?  ?Last edited by Doristine Devoid, CMA on 08/11/2021  8:18 AM.  ?  ?  ?Elevated blood pressure reading without diagnosis of hypertension: Patient here for 3 weeks follow up elevated blood pressure. ?Changes made at last office visit recommend recording BP measures at home until next follow up to determine if this is extended concern vs potential white coat/ situational elevations. BP readings at home are in between 110's-upper 130's/70's-80's ? ?Denies lightheadedness, dizziness ?States he has changed his diet back to normal and is no longer trying to pack on weight for his trail hike.  ? ? ? ?Medications: ?Outpatient Medications Prior to Visit  ?Medication Sig  ? acetaminophen (TYLENOL) 500 MG tablet Take 500 mg by mouth daily as needed.  ? Cholecalciferol (VITAMIN D) 125 MCG (5000 UT) CAPS Take 125 mcg by mouth daily.  ? Multiple Vitamin (MULTIVITAMIN) tablet Take 1 tablet by mouth daily. Occasionally  ? vitamin C (ASCORBIC ACID) 500 MG tablet Take 500 mg by mouth daily.  ? zinc gluconate 50 MG tablet Take 50 mg by mouth daily.  ? ?No facility-administered medications prior to visit.  ? ? ?Review of Systems  ?Respiratory:  Negative for cough and shortness of breath.   ?Cardiovascular:  Negative for chest pain, palpitations and leg swelling.  ?Musculoskeletal:  Negative  for back pain.  ?Neurological:  Negative for dizziness, light-headedness and headaches.  ? ? ?  Objective  ?  ?BP 138/82 (BP Location: Left Arm, Patient Position: Sitting, Cuff Size: Normal)   Pulse (!) 55   Temp (!) 97.4 ?F (36.3 ?C) (Temporal)   Resp 16   Wt 174 lb 9.6 oz (79.2 kg)   BMI 28.18 kg/m?  ? ? ?Physical Exam ?Vitals reviewed.  ?Constitutional:   ?   General: He is awake.  ?   Appearance: Normal appearance. He is well-developed, well-groomed and normal weight.  ?HENT:  ?   Head: Normocephalic and atraumatic.  ?Neck:  ?   Vascular: No carotid bruit.  ?Cardiovascular:  ?   Rate and Rhythm: Normal rate and regular rhythm.  ?   Pulses: Normal pulses.     ?     Radial pulses are 2+ on the right side and 2+ on the left side.  ?   Heart sounds: Normal heart sounds.  ?Pulmonary:  ?   Effort: Pulmonary effort is normal.  ?   Breath sounds: Normal breath sounds and air entry. No decreased breath sounds, wheezing, rhonchi or rales.  ?Musculoskeletal:  ?   Cervical back: Normal range of motion and neck supple.  ?   Right lower leg: No edema.  ?   Left lower leg: No edema.  ?Neurological:  ?   Mental  Status: He is alert.  ?Psychiatric:     ?   Attention and Perception: Attention normal.     ?   Mood and Affect: Mood and affect normal.     ?   Speech: Speech normal.     ?   Behavior: Behavior normal. Behavior is cooperative.     ?   Thought Content: Thought content normal.     ?   Cognition and Memory: Cognition normal.     ?   Judgment: Judgment normal.  ?  ? ? ?No results found for any visits on 08/11/21. ? Assessment & Plan  ?  ? ? ?Problem List Items Addressed This Visit   ? ?  ? Other  ? Hyperlipidemia  ?  Discussed cholesterol- would like to recheck in 3 months as he continues to return to normal diet. ?Suspect last results were elevated due to his efforts to increase weight for planned extended hike rather than sustained elevation.  ?Recheck in 3 months, results to dictate need for intervention at that  time ?Patient is very active and appears to have overall heart healthy diet when eating normally so I do not want to start intervention due to transient diet changes.  ?  ?  ? Relevant Orders  ? Lipid Profile  ? ?Other Visit Diagnoses   ? ? Single episode of elevated blood pressure    -  Primary ?At home BP readings were within normal ranges 120s-130s/70s-80s  ?Do not recommend intervention at this time as patient is very active and appears to engage well with lifestyle modifications ?He is resuming his normal diet after trying to increase his weight for upcoming trail hike- suspect this change was likely contributing to elevations in BP.  ?Follow up as needed.   ? ?  ? ? ? ?No follow-ups on file. ? ? ?I, Jacobie Stamey E Ari Engelbrecht, PA-C, have reviewed all documentation for this visit. The documentation on 08/11/21 for the exam, diagnosis, procedures, and orders are all accurate and complete. ? ? ?Marcellus Pulliam, Glennie [place] MPH ?[place] ?[facility] ? ? ? ?No follow-ups on file.  ?   ? ? ? ? ?Jarrius Huaracha E Adisen Bennion, PA-C  ?[place] ?[phone] (phone) ?[phone] (fax) ? ?[facility]  ?

## 2021-08-11 NOTE — Patient Instructions (Signed)
?  Please return to the office in about 3 months to recheck your cholesterol ?You don't need an apt - just let the staff at the front desk know you need labs and they will take you back ?Please arrive for that apt fasted (no food or drink other than water or black coffee for at least 8 hours prior to the lab draw). ? ?Please continue to exercise and stick to your regular diet.  ?

## 2021-11-11 ENCOUNTER — Other Ambulatory Visit: Payer: Self-pay

## 2021-11-11 DIAGNOSIS — E785 Hyperlipidemia, unspecified: Secondary | ICD-10-CM

## 2021-11-12 LAB — LIPID PANEL
Chol/HDL Ratio: 3.5 ratio (ref 0.0–5.0)
Cholesterol, Total: 172 mg/dL (ref 100–199)
HDL: 49 mg/dL (ref >39)
LDL Chol Calc (NIH): 110 mg/dL — ABNORMAL HIGH (ref 0–99)
Triglycerides: 69 mg/dL (ref 0–149)
VLDL Cholesterol Cal: 13 mg/dL (ref 5–40)

## 2022-04-05 ENCOUNTER — Other Ambulatory Visit: Payer: Self-pay | Admitting: *Deleted

## 2022-04-05 ENCOUNTER — Other Ambulatory Visit: Payer: Medicare HMO

## 2022-04-05 DIAGNOSIS — N400 Enlarged prostate without lower urinary tract symptoms: Secondary | ICD-10-CM

## 2022-04-06 ENCOUNTER — Telehealth: Payer: Self-pay

## 2022-04-06 LAB — PSA: Prostate Specific Ag, Serum: 0.3 ng/mL (ref 0.0–4.0)

## 2022-04-06 NOTE — Telephone Encounter (Signed)
Left message for patient to call back regarding results and follow up appointment.

## 2022-04-06 NOTE — Telephone Encounter (Signed)
-----   Message from Hollice Espy, MD sent at 04/06/2022 11:56 AM EDT ----- Lab work stable.  Had an appt tomorrow but canceled, please invite him to reschedule  Hollice Espy, MD

## 2022-04-07 ENCOUNTER — Other Ambulatory Visit: Payer: Self-pay

## 2022-04-07 ENCOUNTER — Ambulatory Visit: Payer: Medicare HMO | Admitting: Urology

## 2022-04-27 ENCOUNTER — Ambulatory Visit: Payer: Medicare HMO | Admitting: Urology

## 2022-04-27 VITALS — BP 143/87 | HR 56 | Ht 66.0 in | Wt 175.0 lb

## 2022-04-27 DIAGNOSIS — Z125 Encounter for screening for malignant neoplasm of prostate: Secondary | ICD-10-CM | POA: Diagnosis not present

## 2022-04-27 DIAGNOSIS — N5203 Combined arterial insufficiency and corporo-venous occlusive erectile dysfunction: Secondary | ICD-10-CM | POA: Diagnosis not present

## 2022-04-27 DIAGNOSIS — N400 Enlarged prostate without lower urinary tract symptoms: Secondary | ICD-10-CM

## 2022-04-27 DIAGNOSIS — R5383 Other fatigue: Secondary | ICD-10-CM

## 2022-04-27 LAB — BLADDER SCAN AMB NON-IMAGING: Scan Result: 42

## 2022-04-27 MED ORDER — SILDENAFIL CITRATE 20 MG PO TABS: 20.0000 mg | ORAL_TABLET | ORAL | 11 refills | Status: DC | PRN

## 2022-04-27 NOTE — Progress Notes (Unsigned)
04/27/2022 8:50 AM   Scott Le 11/05/1950 [id number]  Referring provider: Birdie Sons, MD [address] [place],  [place] [address]  Chief Complaint  Patient presents with   Follow-up    Follow up for PSA     HPI: 71 year old male who presents today for routine annual follow-up.  Notably, he was previously a patient of Dr. Jacqlyn Larsen on on annual follow-up regimen.  He reports today that he is not taking any medications for his prostate and has been voiding spontaneously without difficulty.  PVR is minimal.  IPSS as below.  He is pleased with his urinary symptoms.  He did have a PSA 6 3 weeks ago which was low and stable.  He continues to be worried about his testosterone levels.  He reports that many years ago when he was lifting and working out regularly, his testosterone was in the 600 range.  Given that his testosterone last year was in the mid 400s, he feels like this is low for him and is affecting his energy and overall quality of life.  His libido is intact.  He does have some mild ED previously was using sildenafil.  He is requesting a refill on this today.  Results for orders placed or performed in visit on 04/27/22  Bladder Scan (Post Void Residual) in office  Result Value Ref Range   Scan Result 42       IPSS     Row Name 04/27/22 1300         International Prostate Symptom Score   How often have you had the sensation of not emptying your bladder? Not at All     How often have you had to urinate less than every two hours? Less than 1 in 5 times     How often have you found you stopped and started again several times when you urinated? Not at All     How often have you found it difficult to postpone urination? Not at All     How often have you had a weak urinary stream? Less than 1 in 5 times     How often have you had to strain to start urination? Not at All     How many times did you typically get up at night to urinate? 1 Time     Total  IPSS Score 3       Quality of Life due to urinary symptoms   If you were to spend the rest of your life with your urinary condition just the way it is now how would you feel about that? Delighted              Score:  1-7 Mild 8-19 Moderate 20-35 Severe    PMH: Past Medical History:  Diagnosis Date   Arthritis    Cataract    Corrected   Seasonal allergies    Shingles    Soft tissue injury     Surgical History: Past Surgical History:  Procedure Laterality Date   EYE SURGERY     Cataracts    ROTATOR CUFF REPAIR Right    VASECTOMY      Home Medications:  Allergies as of 04/27/2022       Reactions   Celecoxib    Other reaction(s): Dry Mouth   Ciprofloxacin Other (See Comments)   Codeine Nausea And Vomiting   Levaquin [levofloxacin] Other (See Comments)   Joint pain   Nsaids Other (See Comments)  Bleeding gums/gum issues        Medication List        Accurate as of April 27, 2022 11:59 PM. If you have any questions, ask your nurse or doctor.          acetaminophen 500 MG tablet Commonly known as: TYLENOL Take 500 mg by mouth daily as needed.   ascorbic acid 500 MG tablet Commonly known as: VITAMIN C Take 500 mg by mouth daily.   multivitamin tablet Take 1 tablet by mouth daily. Occasionally   sildenafil 20 MG tablet Commonly known as: Revatio Take 1 tablet (20 mg total) by mouth as needed. Take 1-5 tabs as needed prior to intercourse   Vitamin D 125 MCG (5000 UT) Caps Take 125 mcg by mouth daily.   zinc gluconate 50 MG tablet Take 50 mg by mouth daily.        Allergies:  Allergies  Allergen Reactions   Celecoxib     Other reaction(s): Dry Mouth   Ciprofloxacin Other (See Comments)   Codeine Nausea And Vomiting   Levaquin [Levofloxacin] Other (See Comments)    Joint pain   Nsaids Other (See Comments)    Bleeding gums/gum issues    Family History: Family History  Problem Relation Age of Onset   CAD Father 43        s/p post first MI @ age 60, passed from MI @ age 71   CAD Mother 55       first MI @ age 75, s/p multiple stents   Valvular heart disease Mother        s/p TAVR    Social History:  reports that he has never smoked. He has never used smokeless tobacco. He reports that he does not drink alcohol and does not use drugs.   Physical Exam: BP (!) 143/87   Pulse (!) 56   Ht '5\' 6"'$  (1.676 m)   Wt 175 lb (79.4 kg)   BMI 28.25 kg/m   Constitutional:  Alert and oriented, No acute distress. HEENT:  AT, moist mucus membranes.  Trachea midline, no masses. Cardiovascular: No clubbing, cyanosis, or edema. Respiratory: Normal respiratory effort, no increased work of breathing.ious lesions. Neurologic: Grossly intact, no focal deficits, moving all 4 extremities. Psychiatric: Normal mood and affect.  Laboratory Data: Lab Results  Component Value Date   WBC 5.3 07/21/2021   HGB 14.6 07/21/2021   HCT 44.1 07/21/2021   MCV 83 07/21/2021   PLT 163 07/21/2021    Lab Results  Component Value Date   CREATININE 1.14 07/21/2021    Lab Results  Component Value Date   TESTOSTERONE 427 04/07/2021    Lab Results  Component Value Date   HGBA1C 4.9 07/29/2015    Assessment & Plan:    1. Benign prostatic hyperplasia, unspecified whether lower urinary tract symptoms present Symptomatically doing well, on no meds  Okay to follow-up as needed at this point in time - Bladder Scan (Post Void Residual) in office  2. Combined arterial insufficiency and corporo-venous occlusive erectile dysfunction Sildenafil was refilled today, prescription sent to [facility]  3. Screening PSA (prostate specific antigen) Today screening updated, deferred rectal exam given his age  62. Low energy We discussed the pathophysiology of hypogonadism, offered to repeat his testosterone level which she declined.  Have his PCP added to his annual labs early next year.  Previous levels were within the normal  range.   Return if symptoms worsen or fail to improve.  Caryl Pina  Erlene Quan, [place] [address] [place], [place] [address] [phone]

## 2022-04-28 ENCOUNTER — Encounter: Payer: Self-pay | Admitting: Urology

## 2022-05-06 ENCOUNTER — Ambulatory Visit: Payer: Medicare HMO | Admitting: Dermatology

## 2022-05-06 VITALS — BP 187/86 | HR 62

## 2022-05-06 DIAGNOSIS — L814 Other melanin hyperpigmentation: Secondary | ICD-10-CM | POA: Diagnosis not present

## 2022-05-06 DIAGNOSIS — M795 Residual foreign body in soft tissue: Secondary | ICD-10-CM | POA: Diagnosis not present

## 2022-05-06 DIAGNOSIS — L821 Other seborrheic keratosis: Secondary | ICD-10-CM | POA: Diagnosis not present

## 2022-05-06 DIAGNOSIS — Z1283 Encounter for screening for malignant neoplasm of skin: Secondary | ICD-10-CM | POA: Diagnosis not present

## 2022-05-06 DIAGNOSIS — L820 Inflamed seborrheic keratosis: Secondary | ICD-10-CM

## 2022-05-06 DIAGNOSIS — D229 Melanocytic nevi, unspecified: Secondary | ICD-10-CM

## 2022-05-06 DIAGNOSIS — L578 Other skin changes due to chronic exposure to nonionizing radiation: Secondary | ICD-10-CM

## 2022-05-06 NOTE — Patient Instructions (Addendum)
Seborrheic Keratosis  What causes seborrheic keratoses? Seborrheic keratoses are harmless, common skin growths that first appear during adult life.  As time goes by, more growths appear.  Some people may develop a large number of them.  Seborrheic keratoses appear on both covered and uncovered body parts.  They are not caused by sunlight.  The tendency to develop seborrheic keratoses can be inherited.  They vary in color from skin-colored to gray, brown, or even black.  They can be either smooth or have a rough, warty surface.   Seborrheic keratoses are superficial and look as if they were stuck on the skin.  Under the microscope this type of keratosis looks like layers upon layers of skin.  That is why at times the top layer may seem to fall off, but the rest of the growth remains and re-grows.    Treatment Seborrheic keratoses do not need to be treated, but can easily be removed in the office.  Seborrheic keratoses often cause symptoms when they rub on clothing or jewelry.  Lesions can be in the way of shaving.  If they become inflamed, they can cause itching, soreness, or burning.  Removal of a seborrheic keratosis can be accomplished by freezing, burning, or surgery. If any spot bleeds, scabs, or grows rapidly, please return to have it checked, as these can be an indication of a skin cancer.  Cryotherapy Aftercare  Wash gently with soap and water everyday.   Apply Vaseline and Band-Aid daily until healed.       Melanoma ABCDEs  Melanoma is the most dangerous type of skin cancer, and is the leading cause of death from skin disease.  You are more likely to develop melanoma if you: Have light-colored skin, light-colored eyes, or red or blond hair Spend a lot of time in the sun Tan regularly, either outdoors or in a tanning bed Have had blistering sunburns, especially during childhood Have a close family member who has had a melanoma Have atypical moles or large birthmarks  Early  detection of melanoma is key since treatment is typically straightforward and cure rates are extremely high if we catch it early.   The first sign of melanoma is often a change in a mole or a new dark spot.  The ABCDE system is a way of remembering the signs of melanoma.  A for asymmetry:  The two halves do not match. B for border:  The edges of the growth are irregular. C for color:  A mixture of colors are present instead of an even brown color. D for diameter:  Melanomas are usually (but not always) greater than 6mm - the size of a pencil eraser. E for evolution:  The spot keeps changing in size, shape, and color.  Please check your skin once per month between visits. You can use a small mirror in front and a large mirror behind you to keep an eye on the back side or your body.   If you see any new or changing lesions before your next follow-up, please call to schedule a visit.  Please continue daily skin protection including broad spectrum sunscreen SPF 30+ to sun-exposed areas, reapplying every 2 hours as needed when you're outdoors.   Staying in the shade or wearing long sleeves, sun glasses (UVA+UVB protection) and wide brim hats (4-inch brim around the entire circumference of the hat) are also recommended for sun protection.     Due to recent changes in healthcare laws, you may see results of   your pathology and/or laboratory studies on MyChart before the doctors have had a chance to review them. We understand that in some cases there may be results that are confusing or concerning to you. Please understand that not all results are received at the same time and often the doctors may need to interpret multiple results in order to provide you with the best plan of care or course of treatment. Therefore, we ask that you please give us 2 business days to thoroughly review all your results before contacting the office for clarification. Should we see a critical lab result, you will be contacted  sooner.   If You Need Anything After Your Visit  If you have any questions or concerns for your doctor, please call our main line at [phone] and press option 4 to reach your doctor's medical assistant. If no one answers, please leave a voicemail as directed and we will return your call as soon as possible. Messages left after 4 pm will be answered the following business day.   You may also send us a message via MyChart. We typically respond to MyChart messages within 1-2 business days.  For prescription refills, please ask your pharmacy to contact our office. Our fax number is [phone].  If you have an urgent issue when the clinic is closed that cannot wait until the next business day, you can page your doctor at the number below.    Please note that while we do our best to be available for urgent issues outside of office hours, we are not available 24/7.   If you have an urgent issue and are unable to reach us, you may choose to seek medical care at your doctor's office, retail clinic, urgent care center, or emergency room.  If you have a medical emergency, please immediately call 911 or go to the emergency department.  Pager Numbers  - Dr. Kowalski: [phone]  - Dr. Moye: [phone]  - Dr. Stewart: [phone]  In the event of inclement weather, please call our main line at [phone] for an update on the status of any delays or closures.  Dermatology Medication Tips: Please keep the boxes that topical medications come in in order to help keep track of the instructions about where and how to use these. Pharmacies typically print the medication instructions only on the boxes and not directly on the medication tubes.   If your medication is too expensive, please contact our office at [phone] option 4 or send us a message through MyChart.   We are unable to tell what your co-pay for medications will be in advance as this is different depending on your insurance  coverage. However, we may be able to find a substitute medication at lower cost or fill out paperwork to get insurance to cover a needed medication.   If a prior authorization is required to get your medication covered by your insurance company, please allow us 1-2 business days to complete this process.  Drug prices often vary depending on where the prescription is filled and some pharmacies may offer cheaper prices.  The website www.goodrx.com contains coupons for medications through different pharmacies. The prices here do not account for what the cost may be with help from insurance (it may be cheaper with your insurance), but the website can give you the price if you did not use any insurance.  - You can print the associated coupon and take it with your prescription to the pharmacy.  - You may also stop   by our office during regular business hours and pick up a GoodRx coupon card.  - If you need your prescription sent electronically to a different pharmacy, notify our office through [facility] MyChart or by phone at [phone] option 4.     Si Usted Necesita Algo Despus de Su Visita  Tambin puede enviarnos un mensaje a travs de MyChart. Por lo general respondemos a los mensajes de MyChart en el transcurso de 1 a 2 das hbiles.  Para renovar recetas, por favor pida a su farmacia que se ponga en contacto con nuestra oficina. Nuestro nmero de fax es el [phone].  Si tiene un asunto urgente cuando la clnica est cerrada y que no puede esperar hasta el siguiente da hbil, puede llamar/localizar a su doctor(a) al nmero que aparece a continuacin.   Por favor, tenga en cuenta que aunque hacemos todo lo posible para estar disponibles para asuntos urgentes fuera del horario de oficina, no estamos disponibles las 24 horas del da, los 7 das de la semana.   Si tiene un problema urgente y no puede comunicarse con nosotros, puede optar por buscar atencin mdica  en el consultorio de  su doctor(a), en una clnica privada, en un centro de atencin urgente o en una sala de emergencias.  Si tiene una emergencia mdica, por favor llame inmediatamente al 911 o vaya a la sala de emergencias.  Nmeros de bper  - Dr. Kowalski: [phone]  - Dra. Moye: [phone]  - Dra. Stewart: [phone]  En caso de inclemencias del tiempo, por favor llame a nuestra lnea principal al [phone] para una actualizacin sobre el estado de cualquier retraso o cierre.  Consejos para la medicacin en dermatologa: Por favor, guarde las cajas en las que vienen los medicamentos de uso tpico para ayudarle a seguir las instrucciones sobre dnde y cmo usarlos. Las farmacias generalmente imprimen las instrucciones del medicamento slo en las cajas y no directamente en los tubos del medicamento.   Si su medicamento es muy caro, por favor, pngase en contacto con nuestra oficina llamando al [phone] y presione la opcin 4 o envenos un mensaje a travs de MyChart.   No podemos decirle cul ser su copago por los medicamentos por adelantado ya que esto es diferente dependiendo de la cobertura de su seguro. Sin embargo, es posible que podamos encontrar un medicamento sustituto a menor costo o llenar un formulario para que el seguro cubra el medicamento que se considera necesario.   Si se requiere una autorizacin previa para que su compaa de seguros cubra su medicamento, por favor permtanos de 1 a 2 das hbiles para completar este proceso.  Los precios de los medicamentos varan con frecuencia dependiendo del lugar de dnde se surte la receta y alguna farmacias pueden ofrecer precios ms baratos.  El sitio web www.goodrx.com tiene cupones para medicamentos de diferentes farmacias. Los precios aqu no tienen en cuenta lo que podra costar con la ayuda del seguro (puede ser ms barato con su seguro), pero el sitio web puede darle el precio si no utiliz ningn seguro.  - Puede imprimir el  cupn correspondiente y llevarlo con su receta a la farmacia.  - Tambin puede pasar por nuestra oficina durante el horario de atencin regular y recoger una tarjeta de cupones de GoodRx.  - Si necesita que su receta se enve electrnicamente a una farmacia diferente, informe a nuestra oficina a travs de MyChart de [facility] o por telfono llamando al [phone] y presione la opcin 4.  

## 2022-05-06 NOTE — Progress Notes (Signed)
Follow-Up Visit   Subjective  Scott Le is a 71 y.o. male who presents for the following: Annual Exam (Tbse, hx of isk, had rough area a left forehead near scalp, spot at left doral hand he would like checked. On right lateral pretibia he would like checked. Spots at neck that he says are irritated on right side. ). The patient presents for Total-Body Skin Exam (TBSE) for skin cancer screening and mole check.  The patient has spots, moles and lesions to be evaluated, some may be new or changing and the patient has concerns that these could be cancer.  The following portions of the chart were reviewed this encounter and updated as appropriate:  Tobacco  Allergies  Meds  Problems  Med Hx  Surg Hx  Fam Hx     Review of Systems: No other skin or systemic complaints except as noted in HPI or Assessment and Plan.  Objective  Well appearing patient in no apparent distress; mood and affect are within normal limits.  A full examination was performed including scalp, head, eyes, ears, nose, lips, neck, chest, axillae, abdomen, back, buttocks, bilateral upper extremities, bilateral lower extremities, hands, feet, fingers, toes, fingernails, and toenails. All findings within normal limits unless otherwise noted below.  right neck x 1, left neck x 2, forehead x 4, right superior lateral calf x 1 (8) Erythematous stuck-on, waxy papule or plaque   Assessment & Plan  Inflamed seborrheic keratosis (8) right neck x 1, left neck x 2, forehead x 4, right superior lateral calf x 1 Symptomatic, irritating, patient would like treated. Patient will call if isk at right superior lateral calf has not resolved in 2 months, otherwise will recheck at next follow up  Destruction of lesion - right neck x 1, left neck x 2, forehead x 4, right superior lateral calf x 1 Complexity: simple   Destruction method: cryotherapy   Informed consent: discussed and consent obtained   Timeout:  patient name, date  of birth, surgical site, and procedure verified Lesion destroyed using liquid nitrogen: Yes   Region frozen until ice ball extended beyond lesion: Yes   Outcome: patient tolerated procedure well with no complications   Post-procedure details: wound care instructions given   Additional details:  Prior to procedure, discussed risks of blister formation, small wound, skin dyspigmentation, or rare scar following cryotherapy. Recommend Vaseline ointment to treated areas while healing.  Foreign body (FB) in soft tissue left dorsum hand Hx of injury from thorn from [place] Possible remnants of thorn still in patient's hand Discussed need for surgery Patient will call to scheduled. Would like to hold off on scheduling due to wife's upcoming surgery.   Lentigines - Scattered tan macules - Due to sun exposure - Benign-appearing, observe - Recommend daily broad spectrum sunscreen SPF 30+ to sun-exposed areas, reapply every 2 hours as needed. - Call for any changes  Seborrheic Keratoses - Stuck-on, waxy, tan-brown papules and/or plaques  - Benign-appearing - Discussed benign etiology and prognosis. - Observe - Call for any changes  Melanocytic Nevi - Tan-brown and/or pink-flesh-colored symmetric macules and papules - Benign appearing on exam today - Observation - Call clinic for new or changing moles - Recommend daily use of broad spectrum spf 30+ sunscreen to sun-exposed areas.   Hemangiomas - Red papules - Discussed benign nature - Observe - Call for any changes  Actinic Damage - Chronic condition, secondary to cumulative UV/sun exposure - diffuse scaly erythematous macules with underlying  dyspigmentation - Recommend daily broad spectrum sunscreen SPF 30+ to sun-exposed areas, reapply every 2 hours as needed.  - Staying in the shade or wearing long sleeves, sun glasses (UVA+UVB protection) and wide brim hats (4-inch brim around the entire circumference of the hat) are also  recommended for sun protection.  - Call for new or changing lesions.  Skin cancer screening performed today. Return in about 1 year (around 05/07/2023) for TBSE. IRuthell Rummage, CMA, am acting as scribe for Sarina Ser, MD. Documentation: I have reviewed the above documentation for accuracy and completeness, and I agree with the above.  Sarina Ser, MD

## 2022-05-19 ENCOUNTER — Telehealth: Payer: Self-pay | Admitting: Family Medicine

## 2022-05-19 NOTE — Telephone Encounter (Signed)
Left message for patient to call back and schedule Medicare Annual Wellness Visit (AWV) in office.   If not able to come in office, please offer to do virtually or by telephone.  Left office number and my jabber [phone].  Last AWV:08/05/2020   Please schedule at anytime with Nurse Health Advisor.

## 2022-05-20 ENCOUNTER — Encounter: Payer: Self-pay | Admitting: Dermatology

## 2022-09-13 ENCOUNTER — Ambulatory Visit
Admission: EM | Admit: 2022-09-13 | Discharge: 2022-09-13 | Disposition: A | Discharge status: Home / Self Care | Payer: Medicare HMO | Attending: Physician Assistant | Admitting: Physician Assistant

## 2022-09-13 ENCOUNTER — Encounter: Payer: Self-pay | Admitting: Emergency Medicine

## 2022-09-13 DIAGNOSIS — Z1152 Encounter for screening for COVID-19: Secondary | ICD-10-CM | POA: Diagnosis not present

## 2022-09-13 DIAGNOSIS — R0981 Nasal congestion: Secondary | ICD-10-CM

## 2022-09-13 DIAGNOSIS — R051 Acute cough: Secondary | ICD-10-CM

## 2022-09-13 DIAGNOSIS — J069 Acute upper respiratory infection, unspecified: Secondary | ICD-10-CM

## 2022-09-13 LAB — SARS CORONAVIRUS 2 BY RT PCR: SARS Coronavirus 2 by RT PCR: NEGATIVE

## 2022-09-13 MED ORDER — PROMETHAZINE-DM 6.25-15 MG/5ML PO SYRP: 5.0000 mL | ORAL_SOLUTION | Freq: Four times a day (QID) | ORAL | 0 refills | Status: AC | PRN

## 2022-09-13 NOTE — ED Triage Notes (Signed)
Pt c/o cough, nasal congestion, body aches, sputum production. Started 4 days ago.

## 2022-09-13 NOTE — ED Provider Notes (Signed)
[facility]    CSN: [id number] Arrival date & time: 09/13/22  8871      History   Chief Complaint Chief Complaint  Patient presents with   Cough   Nasal Congestion    HPI Scott Le is a 72 y.o. male presenting for 4-day history of fatigue, cough, postnasal drainage, scratchy throat, body aches and productive cough.  Patient says sputum has been productive of yellowish material.  Denies fever, breathing difficulty or wheezing, vomiting or diarrhea.  No sick contacts or known exposure to COVID or flu.  Taking Robitussin over-the-counter and says it has helped symptoms.  He feels a little better today than he did yesterday.  No history of asthma or COPD.  No other complaints.  HPI  Past Medical History:  Diagnosis Date   Arthritis    Cataract    Corrected   Seasonal allergies    Shingles    Soft tissue injury     Patient Active Problem List   Diagnosis Date Noted   Lesion of liver less than 1 cm in diameter 02/10/2021   Closed wedge compression fracture of T11 vertebra 02/10/2021   Aortic atherosclerosis 02/10/2021   Thyroid nodule 02/10/2021   Syncope 02/07/2021   AKI (acute kidney injury) 02/07/2021   Elevated troponin 02/07/2021   DDD (degenerative disc disease), lumbar 08/09/2019   OA (osteoarthritis) of hip 08/09/2019   Digital mucous cyst 06/06/2018   Varicose veins of bilateral lower extremities with pain 01/17/2018   Arthritis 01/17/2018   Hyperlipidemia 01/17/2018   Family history of premature coronary heart disease 07/23/2015   Benign localized hyperplasia of prostate with urinary obstruction 05/06/2014   Atrophy of testis 05/06/2014   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 Authorizing  Provider  acetaminophen (TYLENOL) 500 MG tablet Take 500 mg by mouth daily as needed.   Yes [provider]  Cholecalciferol (VITAMIN D) 125 MCG (5000 UT) CAPS Take 125 mcg by mouth daily.   Yes [provider]  Multiple Vitamin (MULTIVITAMIN) tablet Take 1 tablet by mouth daily. Occasionally   Yes [provider]  promethazine-dextromethorphan (PROMETHAZINE-DM) 6.25-15 MG/5ML syrup Take 5 mLs by mouth 4 (four) times daily as needed. 09/13/22  Yes Eusebio Friendly B, PA-C  vitamin C (ASCORBIC ACID) 500 MG tablet Take 500 mg by mouth daily.   Yes [provider]  zinc gluconate 50 MG tablet Take 50 mg by mouth daily.   Yes [provider]    Family History Family History  Problem Relation Age of Onset   CAD Father 58       s/p post first MI @ age 51, passed from MI @ age 30   CAD Mother 80       first MI @ age 74, s/p multiple stents   Valvular heart disease Mother        s/p TAVR    Social History Social History   Tobacco Use   Smoking status: Never   Smokeless tobacco: Never   Tobacco comments:    tried at 29  Vaping Use   Vaping Use: Never used  Substance Use Topics   Alcohol use: No  Alcohol/week: 0.0 standard drinks of alcohol   Drug use: No     Allergies   Celecoxib, Ciprofloxacin, Codeine, Levaquin [levofloxacin], and Nsaids   Review of Systems Review of Systems  Constitutional:  Positive for fatigue. Negative for fever.  HENT:  Positive for congestion, postnasal drip, rhinorrhea and sore throat. Negative for sinus pressure and sinus pain.   Respiratory:  Positive for cough. Negative for shortness of breath.   Gastrointestinal:  Negative for abdominal pain, diarrhea, nausea and vomiting.  Musculoskeletal:  Negative for myalgias.  Neurological:  Negative for weakness, light-headedness and headaches.  Hematological:  Negative for adenopathy.     Physical Exam Triage Vital Signs ED Triage Vitals  Enc Vitals Group      BP      Pulse      Resp      Temp      Temp src      SpO2      Weight      Height      Head Circumference      Peak Flow      Pain Score      Pain Loc      Pain Edu?      Excl. in GC?    No data found.  Updated Vital Signs BP (!) 148/89 (BP Location: Right Arm)   Pulse 86   Temp 99.8 F (37.7 C) (Oral)   Resp 18   Ht 5\' 6"  (1.676 m)   Wt 175 lb 0.7 oz (79.4 kg)   SpO2 94%   BMI 28.25 kg/m       Physical Exam Vitals and nursing note reviewed.  Constitutional:      General: He is not in acute distress.    Appearance: Normal appearance. He is well-developed. He is not ill-appearing.  HENT:     Head: Normocephalic and atraumatic.     Nose: Congestion present.     Mouth/Throat:     Mouth: Mucous membranes are moist.     Pharynx: Oropharynx is clear. Posterior oropharyngeal erythema present.  Eyes:     General: No scleral icterus.    Conjunctiva/sclera: Conjunctivae normal.  Cardiovascular:     Rate and Rhythm: Normal rate and regular rhythm.     Heart sounds: Normal heart sounds.  Pulmonary:     Effort: Pulmonary effort is normal. No respiratory distress.     Breath sounds: Normal breath sounds. No wheezing, rhonchi or rales.  Musculoskeletal:     Cervical back: Neck supple.  Skin:    General: Skin is warm and dry.     Capillary Refill: Capillary refill takes less than 2 seconds.  Neurological:     General: No focal deficit present.     Mental Status: He is alert. Mental status is at baseline.     Motor: No weakness.     Gait: Gait normal.  Psychiatric:        Mood and Affect: Mood normal.        Behavior: Behavior normal.      UC Treatments / Results  Labs (all labs ordered are listed, but only abnormal results are displayed) Labs Reviewed  SARS CORONAVIRUS 2 BY RT PCR    EKG   Radiology No results found.  Procedures Procedures (including critical care time)  Medications Ordered in UC Medications - No data to display  Initial  Impression / Assessment and Plan / UC Course  I have reviewed the triage vital signs and the nursing  notes.  Pertinent labs & imaging results that were available during my care of the patient were reviewed by me and considered in my medical decision making (see chart for details).   72 year old male presents for fatigue, productive cough, congestion x 4 days.  No fever or breathing difficulty.  No sick contacts.  Vitals are stable.  He is overall well-appearing.  On exam he has nasal congestion and erythema posterior pharynx.  Chest clear to auscultation and heart regular rate and rhythm.  COVID testing obtained.  Negative.  Discussed results patient.  Viral illness suspected.  Supportive care encouraged with increasing rest and fluids.  Sent Promethazine DM to pharmacy.  Advised him that he should be getting better within about a week.  Reviewed return precautions.   Final Clinical Impressions(s) / UC Diagnoses   Final diagnoses:  Viral upper respiratory tract infection  Acute cough  Nasal congestion     Discharge Instructions      URI/COLD SYMPTOMS: Your exam today is consistent with a viral illness. Antibiotics are not indicated at this time. Use medications as directed, including cough syrup, nasal saline, and decongestants. Your symptoms should improve over the next few days and resolve within 7-10 days. Increase rest and fluids. F/u if symptoms worsen or predominate such as sore throat, ear pain, productive cough, shortness of breath, or if you develop high fevers or worsening fatigue over the next several days.       ED Prescriptions     Medication Sig Dispense Auth. Provider   promethazine-dextromethorphan (PROMETHAZINE-DM) 6.25-15 MG/5ML syrup Take 5 mLs by mouth 4 (four) times daily as needed. 118 mL Shirlee LatchEaves, Lenoria Narine B, PA-C      PDMP not reviewed this encounter.   Shirlee Latchaves, Jaydah Stahle B, PA-C 09/13/22 1148

## 2022-09-13 NOTE — Discharge Instructions (Signed)
URI/COLD SYMPTOMS: Your exam today is consistent with a viral illness. Antibiotics are not indicated at this time. Use medications as directed, including cough syrup, nasal saline, and decongestants. Your symptoms should improve over the next few days and resolve within 7-10 days. Increase rest and fluids. F/u if symptoms worsen or predominate such as sore throat, ear pain, productive cough, shortness of breath, or if you develop high fevers or worsening fatigue over the next several days.    

## 2022-10-07 IMAGING — US US FNA BIOPSY THYROID 1ST LESION
1 series · 8 of 8 positions shown · non-contrast
Comparison: Ultrasound thyroid 02/20/2021

MEDICATIONS:
Local 1% lidocaine only.

COMPLICATIONS:
None immediate.

INDICATION: Indeterminate thyroid nodule

EXAM:
ULTRASOUND GUIDED FINE NEEDLE ASPIRATION OF INDETERMINATE THYROID
NODULE
TECHNIQUE: Informed written consent was obtained from the patient after a
discussion of the risks, benefits, nondiagnostic sampling and
alternatives to treatment. Questions regarding the procedure were
encouraged and answered. A timeout was performed prior to the
initiation of the procedure.

[Series 1: us fna biopsy thyroid 1st lesion · 0.07mm/px · 8 acquisitions, 8 frames shown]
[im 1/8]
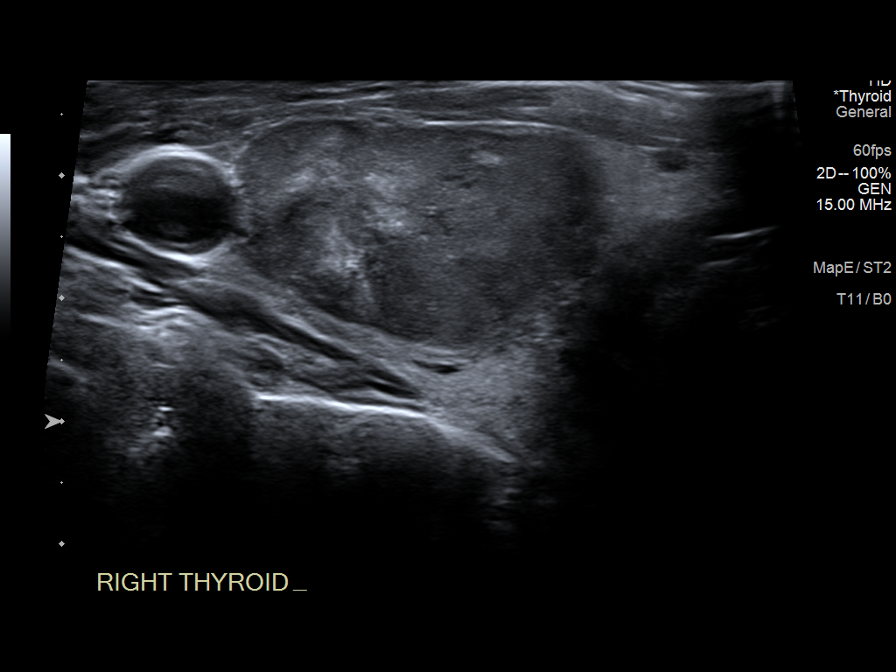
[im 2/8]
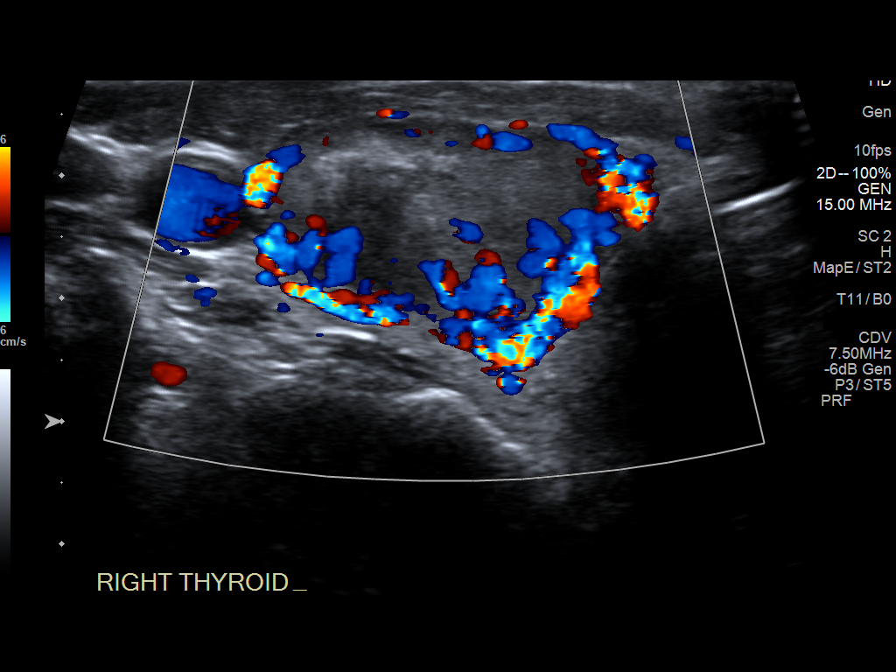
[im 3/8]
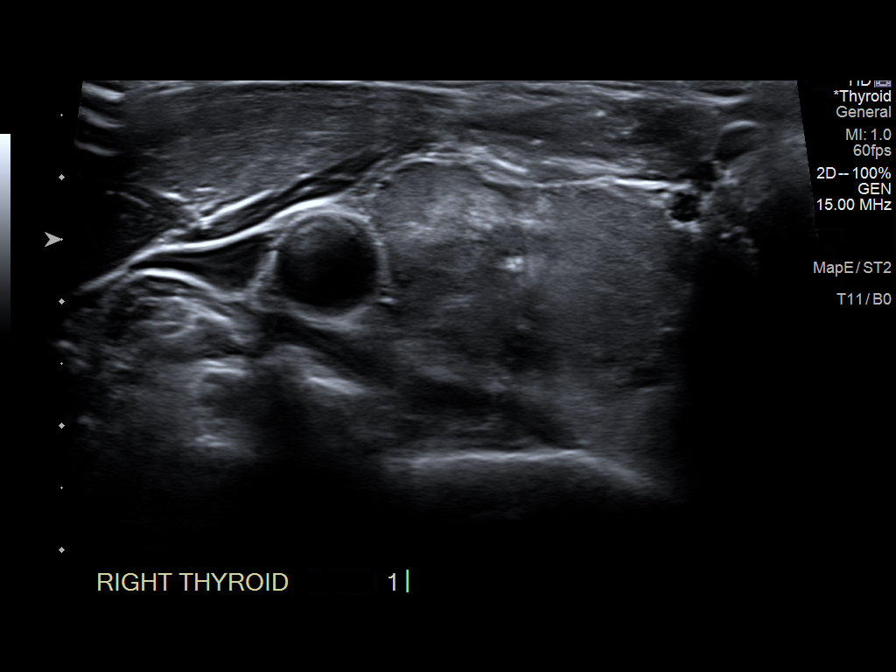
[im 4/8]
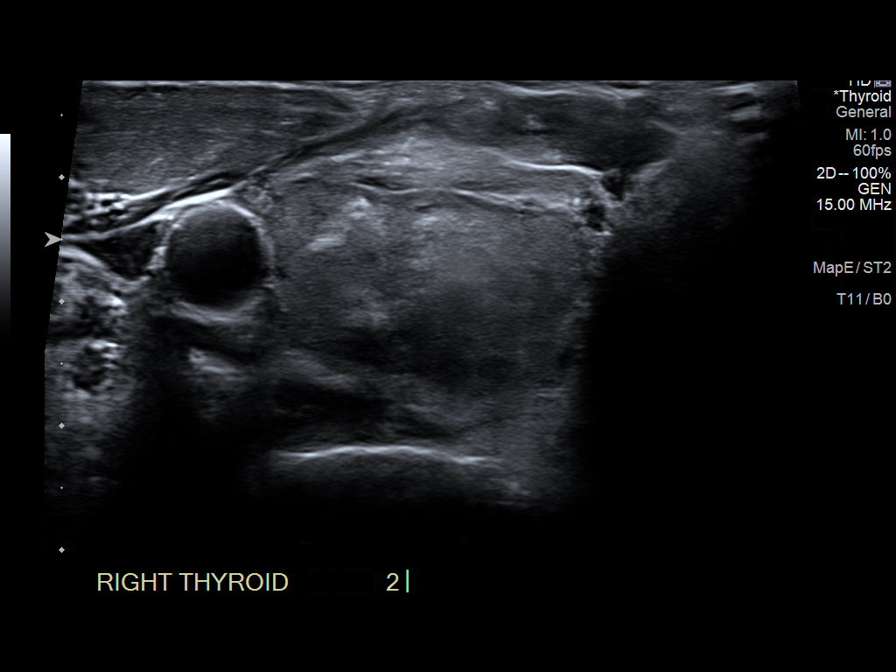
[im 5/8]
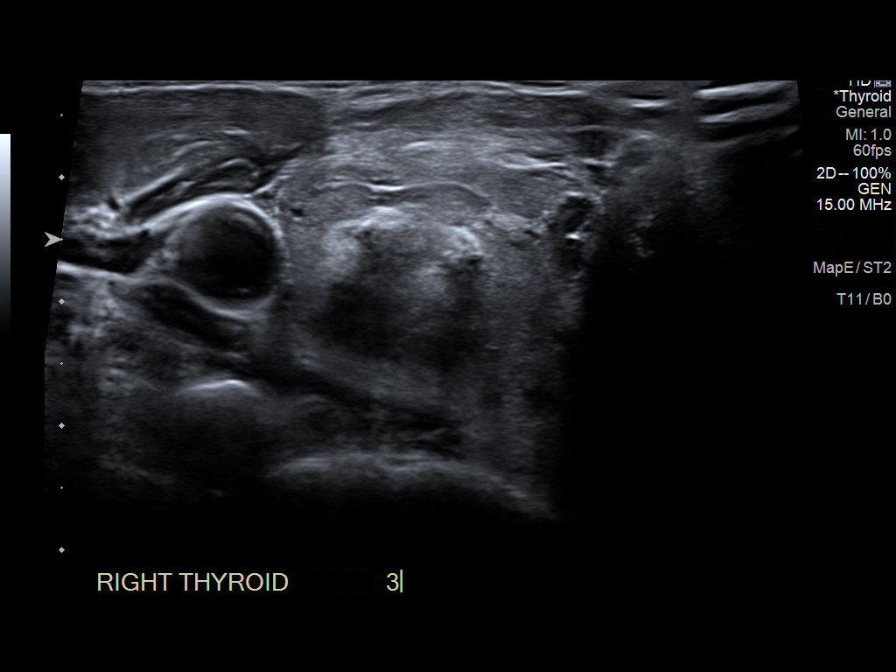
[im 6/8]
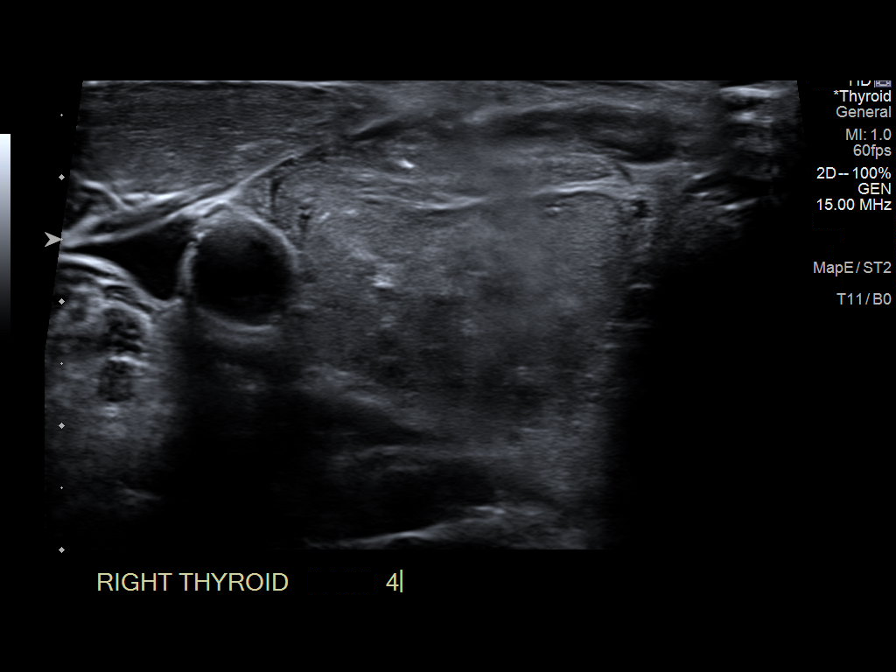
[im 7/8]
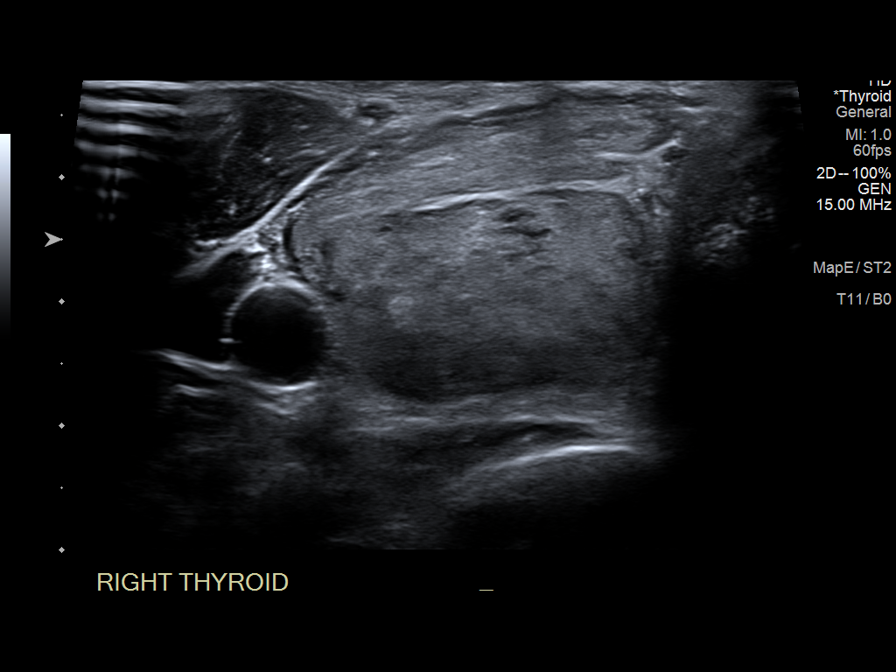
[im 8/8]
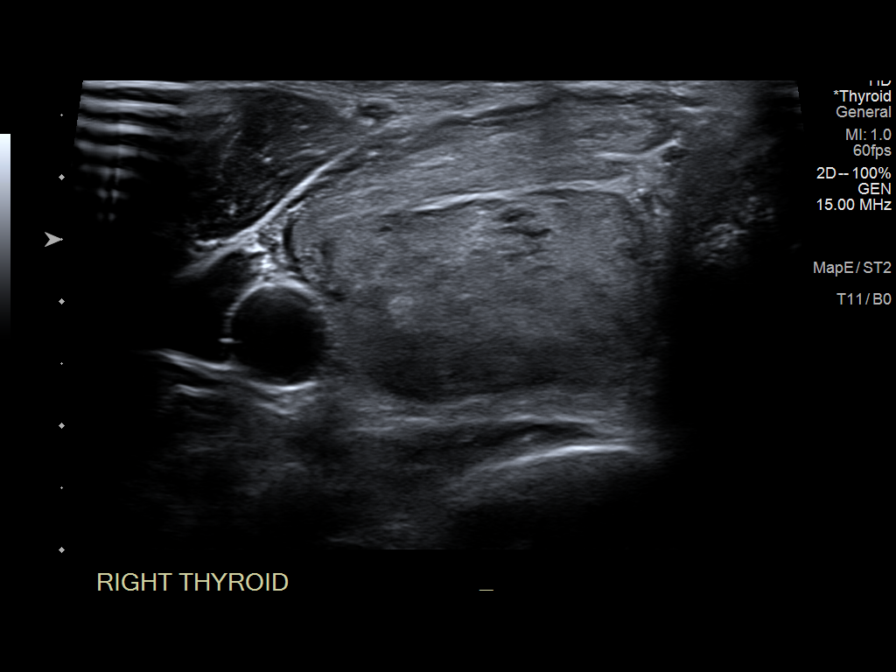

[8 of 8 positions shown; findings below may reference images not displayed]

Pre-procedural ultrasound scanning demonstrated unchanged size and
appearance of the indeterminate nodule within the right thyroid
lobe.

The procedure was planned. The neck was prepped in the usual sterile
fashion, and a sterile drape was applied covering the operative
field. A timeout was performed prior to the initiation of the
procedure. Local anesthesia was provided with 1% lidocaine.

Under direct ultrasound guidance, 4 FNA biopsies were performed of
the right superior thyroid nodule with a 25 gauge needle. Multiple
ultrasound images were saved for procedural documentation purposes.
The samples were prepared and submitted to pathology.

Limited post procedural scanning was negative for hematoma or
additional complication. Dressings were placed. The patient
tolerated the above procedures procedure well without immediate
postprocedural complication.
FINDINGS: FINDINGS
Nodule reference number based on prior diagnostic ultrasound: 1

Maximum size: 3.8 cm

Location: Right;  superior

ACR TI-RADS total points: 4

ACR TI-RADS risk category:  TR4 (4-6 points)

Prior biopsy:  No

Reason for biopsy: meets ACR TI-RADS criteria

Ultrasound imaging confirms appropriate placement of the needles
within the thyroid nodule.
IMPRESSION: Technically successful ultrasound guided fine needle aspiration
biopsy of right superior thyroid nodule.

## 2023-02-15 ENCOUNTER — Ambulatory Visit (INDEPENDENT_AMBULATORY_CARE_PROVIDER_SITE_OTHER): Payer: Medicare HMO

## 2023-02-15 VITALS — BP 118/62 | Ht 62.0 in | Wt 165.1 lb

## 2023-02-15 DIAGNOSIS — Z23 Encounter for immunization: Secondary | ICD-10-CM

## 2023-02-15 DIAGNOSIS — Z0000 Encounter for general adult medical examination without abnormal findings: Secondary | ICD-10-CM | POA: Diagnosis not present

## 2023-02-15 NOTE — Progress Notes (Cosign Needed Addendum)
Subjective:   Scott Le is a 72 y.o. male who presents for Medicare Annual/Subsequent preventive examination.  Visit Complete: In person  Patient Medicare AWV questionnaire was completed by the patient on 02/09/23; I have confirmed that all information answered by patient is correct and no changes since this date.  Review of Systems    Cardiac Risk Factors include: advanced age (>61men, >53 women);dyslipidemia;male gender    Objective:    Today's Vitals   02/09/23 1353 02/15/23 1440  BP: 118/62 118/62  Weight:  165 lb 1.6 oz (74.9 kg)  Height:  5\' 2"  (1.575 m)  PainSc: 2     Body mass index is 30.2 kg/m.     02/15/2023    2:52 PM 09/13/2022   11:09 AM 02/07/2021   10:47 PM 02/07/2021   10:46 PM 02/07/2021    2:27 PM 08/05/2020    8:28 AM 08/02/2019    8:20 AM  Advanced Directives  Does Patient Have a Medical Advance Directive? Yes No Yes No No Yes Yes  Type of Estate agent of [place];Living will  Healthcare Power of [place];Living will   Healthcare Power of [place];Living will Healthcare Power of [place];Living will  Does patient want to make changes to medical advance directive?   No - Patient declined      Copy of Healthcare Power of Attorney in Chart?   No - copy requested   No - copy requested No - copy requested  Would patient like information on creating a medical advance directive?    No - Patient declined       Current Medications (verified) Outpatient Encounter Medications as of 02/15/2023  Medication Sig   acetaminophen (TYLENOL) 500 MG tablet Take 500 mg by mouth daily as needed.   Cholecalciferol (VITAMIN D) 125 MCG (5000 UT) CAPS Take 125 mcg by mouth daily.   Multiple Vitamin (MULTIVITAMIN) tablet Take 1 tablet by mouth daily. Occasionally   vitamin C (ASCORBIC ACID) 500 MG tablet Take 500 mg by mouth daily.   zinc gluconate 50 MG tablet Take 50 mg by mouth daily.   promethazine-dextromethorphan (PROMETHAZINE-DM) 6.25-15 MG/5ML syrup  Take 5 mLs by mouth 4 (four) times daily as needed. (Patient not taking: Reported on 02/15/2023)   No facility-administered encounter medications on file as of 02/15/2023.    Allergies (verified) Celecoxib, Ciprofloxacin, Codeine, Levaquin [levofloxacin], and Nsaids   History: Past Medical History:  Diagnosis Date   Arthritis    Cataract    Corrected   Seasonal allergies    Shingles    Soft tissue injury    Past Surgical History:  Procedure Laterality Date   EYE SURGERY     Cataracts    ROTATOR CUFF REPAIR Right    VASECTOMY     Family History  Problem Relation Age of Onset   CAD Father 42       s/p post first MI @ age 29, passed from MI @ age 26   CAD Mother 98       first MI @ age 35, s/p multiple stents   Valvular heart disease Mother        s/p TAVR   Social History   Socioeconomic History   Marital status: Married    Spouse name: Not on file   Number of children: 2   Years of education: Not on file   Highest education level: Associate degree: occupational, Scientist, product/process development, or vocational program  Occupational History   Occupation: retired  Tobacco Use  Smoking status: Never   Smokeless tobacco: Never   Tobacco comments:    tried at 78  Vaping Use   Vaping status: Never Used  Substance and Sexual Activity   Alcohol use: No   Drug use: No   Sexual activity: Not Currently  Other Topics Concern   Not on file  Social History Narrative   Not on file   Social Determinants of Health   Financial Resource Strain: Low Risk  (02/15/2023)   Overall Financial Resource Strain (CARDIA)    Difficulty of Paying Living Expenses: Not hard at all  Food Insecurity: No Food Insecurity (02/09/2023)   Hunger Vital Sign    Worried About Running Out of Food in the Last Year: Never true    Ran Out of Food in the Last Year: Never true  Transportation Needs: No Transportation Needs (02/09/2023)   PRAPARE - Administrator, Civil Service (Medical): No    Lack of  Transportation (Non-Medical): No  Physical Activity: Sufficiently Active (02/09/2023)   Exercise Vital Sign    Days of Exercise per Week: 7 days    Minutes of Exercise per Session: 40 min  Stress: No Stress Concern Present (02/09/2023)   [employer] of Occupational Health - Occupational Stress Questionnaire    Feeling of Stress : Not at all  Social Connections: Unknown (02/09/2023)   Social Connection and Isolation Panel [NHANES]    Frequency of Communication with Friends and Family: More than three times a week    Frequency of Social Gatherings with Friends and Family: More than three times a week    Attends Religious Services: Not on Marketing executive or Organizations: Yes    Attends Engineer, structural: More than 4 times per year    Marital Status: Married    Tobacco Counseling Counseling given: Not Answered Tobacco comments: tried at 15   Clinical Intake:  Pre-visit preparation completed: Yes  Pain : 0-10 Pain Score: 2  Pain Location: Generalized Pain Descriptors / Indicators: Aching Pain Onset: More than a month ago Pain Frequency: Intermittent Pain Relieving Factors: Tylenol once daily  Pain Relieving Factors: Tylenol once daily  BMI - recorded: 30.2 Nutritional Status: BMI > 30  Obese Nutritional Risks: None Diabetes: No  How often do you need to have someone help you when you read instructions, pamphlets, or other written materials from your doctor or pharmacy?: 1 - Never     Comments: lives with wife Information entered by :: B.Tajuana Kniskern,LPN   Activities of Daily Living    02/09/2023    1:53 PM  In your present state of health, do you have any difficulty performing the following activities:  Hearing? 0  Vision? 0  Difficulty concentrating or making decisions? 0  Walking or climbing stairs? 0  Dressing or bathing? 0  Doing errands, shopping? 0  Preparing Food and eating ? N  Using the Toilet? N  In the past six months, have  you accidently leaked urine? N  Do you have problems with loss of bowel control? N  Managing your Medications? N  Managing your Finances? N  Housekeeping or managing your Housekeeping? N    Patient Care Team: Malva Limes, MD as PCP - General (Family Medicine) Maryella Shivers (Inactive) as Physician Assistant (Physician Assistant) Deirdre Evener, MD (Dermatology) Galen Manila, MD as Referring Physician (Ophthalmology) Bud Face, MD as Referring Physician (Otolaryngology)  Indicate any recent Medical Services you may have  received from other than [facility] providers in the past year (date may be approximate).     Assessment:   This is a routine wellness examination for Kerrigan.  Hearing/Vision screen Hearing Screening - Comments:: Adequate hearing Vision Screening - Comments:: Adequate vision; after cataract surgery   Goals Addressed   None    Depression Screen    02/15/2023    2:51 PM 07/21/2021    8:43 AM 08/05/2020    8:26 AM 06/18/2019    2:47 PM  PHQ 2/9 Scores  PHQ - 2 Score 0 0 0 0  PHQ- 9 Score    0    Fall Risk    02/09/2023    1:53 PM 07/21/2021    8:43 AM 08/05/2020    8:28 AM 08/02/2019    8:20 AM 06/18/2019    2:47 PM  Fall Risk   Falls in the past year? 0 0 0 0 0  Number falls in past yr:  0 0 0 0  Injury with Fall? 0 0 0 0 0  Risk for fall due to : No Fall Risks      Follow up Falls prevention discussed;Education provided        MEDICARE RISK AT HOME: Medicare Risk at Home Any stairs in or around the home?: Yes If so, are there any without handrails?: No Home free of loose throw rugs in walkways, pet beds, electrical cords, etc?: Yes Adequate lighting in your home to reduce risk of falls?: Yes Life alert?: No Use of a cane, walker or w/c?: No Grab bars in the bathroom?: No Shower chair or bench in shower?: Yes Elevated toilet seat or a handicapped toilet?: Yes  TIMED UP AND GO:  Was the test performed?  Yes  Length of time  to ambulate 10 feet: 8 sec Gait steady and fast without use of assistive device    Cognitive Function:        02/15/2023    3:03 PM 02/15/2023    2:57 PM  6CIT Screen  What Year?  0 points  What month?  0 points  What time? 0 points 0 points  Count back from 20 0 points 0 points  Months in reverse 0 points 0 points  Repeat phrase 0 points 0 points  Total Score  0 points    Immunizations Immunization History  Administered Date(s) Administered   Fluad Quad(high Dose 65+) 02/10/2021   Fluad Trivalent(High Dose 65+) 02/15/2023   Influenza-Unspecified 03/01/2019   PFIZER(Purple Top)SARS-COV-2 Vaccination 07/14/2019, 08/08/2019   Pneumococcal Conjugate-13 03/06/2017   Pneumococcal Polysaccharide-23 02/06/2018   Rabies, IM 03/09/2016, 03/12/2016, 03/16/2016, 03/23/2016   Tdap 01/05/2012, 03/09/2016   Zoster Recombinant(Shingrix) 02/17/2020    TDAP status: Up to date  Flu Vaccine status: Completed at today's visit  Pneumococcal vaccine status: Up to date  Covid-19 vaccine status: Completed vaccines  Qualifies for Shingles Vaccine? Yes   Zostavax completed Yes  #1 Shingrix Completed?: No.    Education has been provided regarding the importance of this vaccine. Patient has been advised to call insurance company to determine out of pocket expense if they have not yet received this vaccine. Advised may also receive vaccine at local pharmacy or Health Dept. Verbalized acceptance and understanding.  Screening Tests Health Maintenance  Topic Date Due   Zoster Vaccines- Shingrix (2 of 2) 04/13/2020   COVID-19 Vaccine (3 - Pfizer risk series) 03/03/2023 (Originally 09/05/2019)   Colonoscopy  02/15/2024 (Originally 05/26/2022)   Medicare Annual Wellness (AWV)  02/15/2024  DTaP/Tdap/Td (3 - Td or Tdap) 03/09/2026   Pneumonia Vaccine 64+ Years old  Completed   INFLUENZA VACCINE  Completed   Hepatitis C Screening  Completed   HPV VACCINES  Aged Out    Health Maintenance  Health  Maintenance Due  Topic Date Due   Zoster Vaccines- Shingrix (2 of 2) 04/13/2020    Colorectal cancer screening: Type of screening: Colonoscopy. Completed yes. Repeat every 5-10 years  Lung Cancer Screening: (Low Dose CT Chest recommended if Age 37-80 years, 20 pack-year currently smoking OR have quit w/in 15years.) does not qualify.   Lung Cancer Screening Referral: no  Additional Screening:  Hepatitis C Screening: does not qualify; Completed yes  Vision Screening: Recommended annual ophthalmology exams for early detection of glaucoma and other disorders of the eye. Is the patient up to date with their annual eye exam?  Yes  Who is the provider or what is the name of the office in which the patient attends annual eye exams? Dr Dellie Burns If pt is not established with a provider, would they like to be referred to a provider to establish care? No .   Dental Screening: Recommended annual dental exams for proper oral hygiene  Diabetic Foot Exam: n/a  Community Resource Referral / Chronic Care Management: CRR required this visit?  No   CCM required this visit?  Appt scheduled with PCP    Plan:     I have personally reviewed and noted the following in the patient's chart:   Medical and social history Use of alcohol, tobacco or illicit drugs  Current medications and supplements including opioid prescriptions. Patient is not currently taking opioid prescriptions. Functional ability and status Nutritional status Physical activity Advanced directives List of other physicians Hospitalizations, surgeries, and ER visits in previous 12 months Vitals Screenings to include cognitive, depression, and falls Referrals and appointments  In addition, I have reviewed and discussed with patient certain preventive protocols, quality metrics, and best practice recommendations. A written personalized care plan for preventive services as well as general preventive health recommendations were  provided to patient.     Sue Lush, LPN   1/61/0960   After Visit Summary: (MyChart) Due to this being a telephonic visit, the after visit summary with patients personalized plan was offered to patient via MyChart PRINTED ALSO AND GIVEN TO PT FOR VISIT INFO  Nurse Notes: Pt relays he has progressively lost weight over the years and wonders if his testosterone is low. He inquired to have levels checked. I made appt w/PCP for PE (not seen in two years).  Flu vaccine given.Marland Kitchen

## 2023-02-15 NOTE — Patient Instructions (Addendum)
Mr. Laible , Thank you for taking time to come for your Medicare Wellness Visit. I appreciate your ongoing commitment to your health goals. Please review the following plan we discussed and let me know if I can assist you in the future.   Referrals/Orders/Follow-Ups/Clinician Recommendations: Influenza ordered  This is a list of the screening recommended for you and due dates:  Health Maintenance  Topic Date Due   Zoster (Shingles) Vaccine (2 of 2) 04/13/2020   COVID-19 Vaccine (3 - Pfizer risk series) 03/03/2023*   Colon Cancer Screening  02/15/2024*   Medicare Annual Wellness Visit  02/15/2024   DTaP/Tdap/Td vaccine (3 - Td or Tdap) 03/09/2026   Pneumonia Vaccine  Completed   Flu Shot  Completed   Hepatitis C Screening  Completed   HPV Vaccine  Aged Out  *Topic was postponed. The date shown is not the original due date.    Advanced directives: (Copy Requested) Please bring a copy of your health care power of attorney and living will to the office to be added to your chart at your convenience.  Next Medicare Annual Wellness Visit scheduled for next year: Yes 02/20/23 @ 2pm in person

## 2023-02-18 ENCOUNTER — Telehealth: Payer: Self-pay

## 2023-02-18 NOTE — Telephone Encounter (Signed)
Copied from CRM [id number]. Topic: General - Other >> Feb 18, 2023 10:40 AM Dondra Prader A wrote: Pt wife Erma Pinto is calling wanting to speak with Okey Regal. Per Erma Pinto pt came in on 02/15/2023 for a Medicare AWV and did not know that he was not going to see Dr. Sherrie Mustache. Pt is upset that he did not get to see his PCP and was not aware that he would be having a Medicare AWV appt with someone else. Pt has a physical scheduled for 02/21/2024 that he wanted canceled and he will call back later to reschedule that appt ( I did cancel his physical appt).  Please have Okey Regal call Kathe back.

## 2023-02-21 NOTE — Telephone Encounter (Signed)
Spoke with [place] as requested. Patient didn't want to see Wellness nurse on his visit on 02/15/23 but did anyway.   Patient wasn't wanting to schedule a physical at this time with Dr. Sherrie Mustache.

## 2023-02-21 NOTE — Telephone Encounter (Signed)
Spoke with [place] as

## 2023-05-11 ENCOUNTER — Encounter: Payer: Self-pay | Admitting: Dermatology

## 2023-05-11 ENCOUNTER — Ambulatory Visit: Payer: Medicare HMO | Admitting: Dermatology

## 2023-05-11 DIAGNOSIS — D492 Neoplasm of unspecified behavior of bone, soft tissue, and skin: Secondary | ICD-10-CM | POA: Diagnosis not present

## 2023-05-11 DIAGNOSIS — D225 Melanocytic nevi of trunk: Secondary | ICD-10-CM | POA: Diagnosis not present

## 2023-05-11 DIAGNOSIS — L814 Other melanin hyperpigmentation: Secondary | ICD-10-CM

## 2023-05-11 DIAGNOSIS — Z7189 Other specified counseling: Secondary | ICD-10-CM

## 2023-05-11 DIAGNOSIS — L720 Epidermal cyst: Secondary | ICD-10-CM

## 2023-05-11 DIAGNOSIS — Z1283 Encounter for screening for malignant neoplasm of skin: Secondary | ICD-10-CM | POA: Diagnosis not present

## 2023-05-11 DIAGNOSIS — Z719 Counseling, unspecified: Secondary | ICD-10-CM

## 2023-05-11 DIAGNOSIS — L820 Inflamed seborrheic keratosis: Secondary | ICD-10-CM | POA: Diagnosis not present

## 2023-05-11 DIAGNOSIS — W908XXA Exposure to other nonionizing radiation, initial encounter: Secondary | ICD-10-CM

## 2023-05-11 DIAGNOSIS — B078 Other viral warts: Secondary | ICD-10-CM | POA: Diagnosis not present

## 2023-05-11 DIAGNOSIS — D229 Melanocytic nevi, unspecified: Secondary | ICD-10-CM

## 2023-05-11 DIAGNOSIS — D1801 Hemangioma of skin and subcutaneous tissue: Secondary | ICD-10-CM

## 2023-05-11 DIAGNOSIS — L821 Other seborrheic keratosis: Secondary | ICD-10-CM

## 2023-05-11 DIAGNOSIS — L578 Other skin changes due to chronic exposure to nonionizing radiation: Secondary | ICD-10-CM | POA: Diagnosis not present

## 2023-05-11 NOTE — Patient Instructions (Addendum)
Wound Care Instructions  Cleanse wound gently with soap and water once a day then pat dry with clean gauze. Apply a thin coat of Petrolatum (petroleum jelly, "Vaseline") over the wound (unless you have an allergy to this). We recommend that you use a new, sterile tube of Vaseline. Do not pick or remove scabs. Do not remove the yellow or white "healing tissue" from the base of the wound.  Cover the wound with fresh, clean, nonstick gauze and secure with paper tape. You may use Band-Aids in place of gauze and tape if the wound is small enough, but would recommend trimming much of the tape off as there is often too much. Sometimes Band-Aids can irritate the skin.  You should call the office for your biopsy report after 1 week if you have not already been contacted.  If you experience any problems, such as abnormal amounts of bleeding, swelling, significant bruising, significant pain, or evidence of infection, please call the office immediately.  FOR ADULT SURGERY PATIENTS: If you need something for pain relief you may take 1 extra strength Tylenol (acetaminophen) AND 2 Ibuprofen (200mg  each) together every 4 hours as needed for pain. (do not take these if you are allergic to them or if you have a reason you should not take them.) Typically, you may only need pain medication for 1 to 3 days.     Cryotherapy Aftercare  Wash gently with soap and water everyday.   Apply Vaseline and Band-Aid daily until healed.    Viral Warts & Molluscum Contagiosum  Viral warts and molluscum contagiosum are growths of the skin caused by viral infection of the skin. If you have been given the diagnosis of viral warts or molluscum contagiosum there are a few things that you must understand about your condition:  There is no guaranteed treatment method available for this condition. Multiple treatments may be required, The treatments may be time consuming and require multiple visits to the dermatology office. The  treatment may be expensive. You will be charged each time you come into the office to have the spots treated. The treated areas may develop new lesions further complicating treatment. The treated areas may leave a scar. There is no guarantee that even after multiple treatments that the spots will be successfully treated. These are caused by a viral infection and can be spread to other areas of the skin and to other people by direct contact. Therefore, new spots may occur.    Pre-Operative Instructions  You are scheduled for a surgical procedure at [facility]. We recommend you read the following instructions. If you have any questions or concerns, please call the office at [phone].  Shower and wash the entire body with soap and water the day of your surgery paying special attention to cleansing at and around the planned surgery site.  Avoid aspirin or aspirin containing products at least fourteen (14) days prior to your surgical procedure and for at least one week (7 Days) after your surgical procedure. If you take aspirin on a regular basis for heart disease or history of stroke or for any other reason, we may recommend you continue taking aspirin but please notify us if you take this on a regular basis. Aspirin can cause more bleeding to occur during surgery as well as prolonged bleeding and bruising after surgery.   Avoid other nonsteroidal pain medications at least one week prior to surgery and at least one week prior to your surgery. These include medications such  as Ibuprofen (Motrin, Advil and Nuprin), Naprosyn, Voltaren, Relafen, etc. If medications are used for therapeutic reasons, please inform us as they can cause increased bleeding or prolonged bleeding during and bruising after surgical procedures.   Please advise [place] if you are taking any "blood thinner" medications such as Coumadin or Dipyridamole or Plavix or similar medications. These cause increased bleeding and  prolonged bleeding during procedures and bruising after surgical procedures. We may have to consider discontinuing these medications briefly prior to and shortly after your surgery if safe to do so.   Please inform us of all medications you are currently taking. All medications that are taken regularly should be taken the day of surgery as you always do. Nevertheless, we need to be informed of what medications you are taking prior to surgery to know whether they will affect the procedure or cause any complications.   Please inform us of any medication allergies. Also inform us of whether you have allergies to Latex or rubber products or whether you have had any adverse reaction to Lidocaine or Epinephrine.  Please inform us of any prosthetic or artificial body parts such as artificial heart valve, joint replacements, etc., or similar condition that might require preoperative antibiotics.   We recommend avoidance of alcohol at least two weeks prior to surgery and continued avoidance for at least two weeks after surgery.   We recommend discontinuation of tobacco smoking at least two weeks prior to surgery and continued abstinence for at least two weeks after surgery.  Do not plan strenuous exercise, strenuous work or strenuous lifting for approximately four weeks after your surgery.   We request if you are unable to make your scheduled surgical appointment, please call us at least a week in advance or as soon as you are aware of a problem so that we can cancel or reschedule the appointment.   You MAY TAKE TYLENOL (acetaminophen) for pain as it is not a blood thinner.   PLEASE PLAN TO BE IN TOWN FOR TWO WEEKS FOLLOWING SURGERY, THIS IS IMPORTANT SO YOU CAN BE CHECKED FOR DRESSING CHANGES, SUTURE REMOVAL AND TO MONITOR FOR POSSIBLE COMPLICATIONS.

## 2023-05-11 NOTE — Progress Notes (Signed)
Follow-Up Visit   Subjective  Scott Le is a 72 y.o. male who presents for the following: Skin Cancer Screening and Full Body Skin Exam, cyst L post waist, irritating, check spot L ant thigh, wart L foot, R thumb, mole buttocks has gotten larger  The patient presents for Total-Body Skin Exam (TBSE) for skin cancer screening and mole check. The patient has spots, moles and lesions to be evaluated, some may be new or changing and the patient may have concern these could be cancer.    The following portions of the chart were reviewed this encounter and updated as appropriate: medications, allergies, medical history  Review of Systems:  No other skin or systemic complaints except as noted in HPI or Assessment and Plan.  Objective  Well appearing patient in no apparent distress; mood and affect are within normal limits.  A full examination was performed including scalp, head, eyes, ears, nose, lips, neck, chest, axillae, abdomen, back, buttocks, bilateral upper extremities, bilateral lower extremities, hands, feet, fingers, toes, fingernails, and toenails. All findings within normal limits unless otherwise noted below.   Relevant physical exam findings are noted in the Assessment and Plan.  L sup buttocks crease 0.6cm fleshy pap  L thigh x 1, L pretibial x 1 (2) Stuck on waxy paps with erythema    Assessment & Plan   SKIN CANCER SCREENING PERFORMED TODAY.  ACTINIC DAMAGE - Chronic condition, secondary to cumulative UV/sun exposure - diffuse scaly erythematous macules with underlying dyspigmentation - Recommend daily broad spectrum sunscreen SPF 30+ to sun-exposed areas, reapply every 2 hours as needed.  - Staying in the shade or wearing long sleeves, sun glasses (UVA+UVB protection) and wide brim hats (4-inch brim around the entire circumference of the hat) are also recommended for sun protection.  - Call for new or changing lesions.  LENTIGINES, SEBORRHEIC KERATOSES,  HEMANGIOMAS - Benign normal skin lesions - Benign-appearing - Call for any changes  MELANOCYTIC NEVI - Tan-brown and/or pink-flesh-colored symmetric macules and papules - Benign appearing on exam today - Observation - Call clinic for new or changing moles - Recommend daily use of broad spectrum spf 30+ sunscreen to sun-exposed areas.   EPIDERMAL INCLUSION CYST vs Lipoma vs other L post waistline Exam: Subcutaneous nodule at L post waistline  Benign-appearing. Exam most consistent with an epidermal inclusion cyst. Discussed that a cyst is a benign growth that can grow over time and sometimes get irritated or inflamed. Recommend observation if it is not bothersome. Discussed option of surgical excision to remove it if it is growing, symptomatic, or other changes noted. Please call for new or changing lesions so they can be evaluated.  Neoplasm of skin L sup buttocks crease  Epidermal / dermal shaving  Lesion diameter (cm):  0.6 Informed consent: discussed and consent obtained   Timeout: patient name, date of birth, surgical site, and procedure verified   Procedure prep:  Patient was prepped and draped in usual sterile fashion Prep type:  Isopropyl alcohol Anesthesia: the lesion was anesthetized in a standard fashion   Anesthetic:  1% lidocaine w/ epinephrine 1-100,000 buffered w/ 8.4% NaHCO3 Instrument used: flexible razor blade   Hemostasis achieved with: pressure, aluminum chloride and electrodesiccation   Outcome: patient tolerated procedure well   Post-procedure details: sterile dressing applied and wound care instructions given   Dressing type: bandage and petrolatum    Specimen 1 - Surgical pathology Differential Diagnosis: D48.5 Irritated Nevus vs other  Check Margins: yes 0.6cm fleshy pap  Other viral warts (2) L plantar foot x 1, R thumb x 1  Viral Wart (HPV) Counseling  Discussed viral / HPV (Human Papilloma Virus) etiology and risk of spread /infectivity to other  areas of body as well as to other people.  Multiple treatments and methods may be required to clear warts and it is possible treatment may not be successful.  Treatment risks include discoloration; scarring and there is still potential for wart recurrence.  Destruction of lesion - L plantar foot x 1, R thumb x 1 (2) Complexity: simple   Destruction method: cryotherapy   Informed consent: discussed and consent obtained   Timeout:  patient name, date of birth, surgical site, and procedure verified Lesion destroyed using liquid nitrogen: Yes   Region frozen until ice ball extended beyond lesion: Yes   Outcome: patient tolerated procedure well with no complications   Post-procedure details: wound care instructions given    Inflamed seborrheic keratosis (2) L thigh x 1, L pretibial x 1  Symptomatic, irritating, patient would like treated.  Destruction of lesion - L thigh x 1, L pretibial x 1 (2) Complexity: simple   Destruction method: cryotherapy   Informed consent: discussed and consent obtained   Timeout:  patient name, date of birth, surgical site, and procedure verified Lesion destroyed using liquid nitrogen: Yes   Region frozen until ice ball extended beyond lesion: Yes   Outcome: patient tolerated procedure well with no complications   Post-procedure details: wound care instructions given     Return for as scheduled for surgery cyst, 1 yr TBSE .  I, Ardis Rowan, RMA, am acting as scribe for Armida Sans, MD .   Documentation: I have reviewed the above documentation for accuracy and completeness, and I agree with the above.  Armida Sans, MD

## 2023-05-16 LAB — SURGICAL PATHOLOGY

## 2023-05-17 ENCOUNTER — Telehealth: Payer: Self-pay

## 2023-05-17 NOTE — Telephone Encounter (Addendum)
Called and discussed results with patient. He verbalized understanding and denied further questions.    ----- Message from Armida Sans sent at 05/16/2023  6:00 PM EST ----- FINAL DIAGNOSIS        1. Skin, L sup buttocks crease :       MELANOCYTIC NEVUS, INTRADERMAL TYPE, BASE INVOLVED   Benign mole No further treatment needed

## 2023-05-24 ENCOUNTER — Ambulatory Visit: Payer: Medicare HMO | Admitting: Dermatology

## 2023-05-24 ENCOUNTER — Telehealth: Payer: Self-pay

## 2023-05-24 ENCOUNTER — Encounter: Payer: Self-pay | Admitting: Dermatology

## 2023-05-24 DIAGNOSIS — D171 Benign lipomatous neoplasm of skin and subcutaneous tissue of trunk: Secondary | ICD-10-CM | POA: Diagnosis not present

## 2023-05-24 DIAGNOSIS — D492 Neoplasm of unspecified behavior of bone, soft tissue, and skin: Secondary | ICD-10-CM

## 2023-05-24 DIAGNOSIS — D485 Neoplasm of uncertain behavior of skin: Secondary | ICD-10-CM

## 2023-05-24 MED ORDER — MUPIROCIN 2 % EX OINT: 1.0000 | TOPICAL_OINTMENT | Freq: Every day | CUTANEOUS | 1 refills | Status: AC

## 2023-05-24 MED ORDER — DOXYCYCLINE MONOHYDRATE 100 MG PO CAPS: 100.0000 mg | ORAL_CAPSULE | Freq: Two times a day (BID) | ORAL | 0 refills | Status: AC

## 2023-05-24 NOTE — Progress Notes (Signed)
Follow-Up Visit   Subjective  Scott Le is a 72 y.o. male who presents for the following: Lipoma vs other of the L post waistline, patient presents today for excision.   The following portions of the chart were reviewed this encounter and updated as appropriate: medications, allergies, medical history  Review of Systems:  No other skin or systemic complaints except as noted in HPI or Assessment and Plan.  Objective  Well appearing patient in no apparent distress; mood and affect are within normal limits.   A focused examination was performed of the following areas: the trunk   Relevant exam findings are noted in the Assessment and Plan.  L post waistline Rubbery nodule 3.1cm  Assessment & Plan     NEOPLASM OF UNCERTAIN BEHAVIOR OF SKIN L post waistline Skin excision  Lesion length (cm):  3.1 Lesion width (cm):  3.1 Margin per side (cm):  0 Total excision diameter (cm):  3.1 Informed consent: discussed and consent obtained   Timeout: patient name, date of birth, surgical site, and procedure verified   Procedure prep:  Patient was prepped and draped in usual sterile fashion Prep type:  Isopropyl alcohol and povidone-iodine Anesthesia: the lesion was anesthetized in a standard fashion   Anesthetic:  1% lidocaine w/ epinephrine 1-100,000 buffered w/ 8.4% NaHCO3 (9cc lido w/ epi) Instrument used comment:  #15c blade Hemostasis achieved with: pressure   Outcome: patient tolerated procedure well with no complications   Post-procedure details: sterile dressing applied and wound care instructions given   Dressing type: bandage and pressure dressing (Mupirocin)    Skin repair Complexity:  Complex Final length (cm):  3 Reason for type of repair: reduce tension to allow closure, reduce the risk of dehiscence, infection, and necrosis, reduce subcutaneous dead space and avoid a hematoma, allow closure of the large defect, preserve normal anatomy, preserve normal  anatomical and functional relationships and enhance both functionality and cosmetic results   Undermining: area extensively undermined   Undermining comment:  Undermining Defect 3.1cm Subcutaneous layers (deep stitches):  Suture size:  3-0 Suture type: Vicryl (polyglactin 910)   Subcutaneous suture technique: Inverted Dermal. Fine/surface layer approximation (top stitches):  Suture size:  3-0 Suture type: nylon   Stitches: horizontal mattress   Suture removal (days):  6 Hemostasis achieved with: pressure Outcome: patient tolerated procedure well with no complications   Post-procedure details: sterile dressing applied and wound care instructions given   Dressing type: bandage, pressure dressing and bacitracin (Mupirocin)    mupirocin ointment (BACTROBAN) 2 % Apply 1 Application topically daily. Qd to excision site Specimen 1 - Surgical pathology Differential Diagnosis: Lipoma vs other  Check Margins: No Rubbery nodule, 3.1cm Lipoma vs other, discussed due to mobility of nodule risk of not being able to locate nodule once excised into, pt would like to proceed with surgery  Start Mupirocin oint qd to excision site Start Doxycycline 100mg  1 po bid with food and drink for 7 days  Doxycycline should be taken with food to prevent nausea. Do not lay down for 30 minutes after taking. Be cautious with sun exposure and use good sun protection while on this medication. Pregnant women should not take this medication.   Related Medications doxycycline (MONODOX) 100 MG capsule Take 1 capsule (100 mg total) by mouth 2 (two) times daily for 7 days. Take with food and drink  Return in about 6 days (around 05/30/2023) for suture removal.  I, Ardis Rowan, RMA, am acting as scribe for [employer]  Gwen Pounds, MD .   Documentation: I have reviewed the above documentation for accuracy and completeness, and I agree with the above.  Armida Sans, MD

## 2023-05-24 NOTE — Patient Instructions (Addendum)
 Doxycycline should be taken with food to prevent nausea. Do not lay down for 30 minutes after taking. Be cautious with sun exposure and use good sun protection while on this medication. Pregnant women should not take this medication.     Wound Care Instructions for After Surgery  On the day following your surgery, you should begin doing daily dressing changes until your sutures are removed: Remove the bandage. Cleanse the wound gently with soap and water.  Make sure you then dry the skin surrounding the wound completely or the tape will not stick to the skin. Do not use cotton balls on the wound. After the wound is clean and dry, apply the ointment (either prescription antibiotic prescribed by your doctor or plain Vaseline if nothing was prescribed) gently with a Q-tip. If you are using a bandaid to cover: Apply a bandaid large enough to cover the entire wound. If you do not have a bandaid large enough to cover the wound OR if you are sensitive to bandaid adhesive: Cut a non-stick pad (such as Telfa) to fit the size of the wound.  Cover the wound with the non-stick pad. If the wound is draining, you may want to add a small amount of gauze on top of the non-stick pad for a little added compression to the area. Use tape to seal the area completely.  For the next 1-2 weeks: Be sure to keep the wound moist with ointment 24/7 to ensure best healing. If you are unable to cover the wound with a bandage to hold the ointment in place, you may need to reapply the ointment several times a day. Do not bend over or lift heavy items to reduce the chance of elevated blood pressure to the wound. Do not participate in particularly strenuous activities.  Below is a list of dressing supplies you might need.  Cotton-tipped applicators - Q-tips Gauze pads (2x2 and/or 4x4) - All-Purpose Sponges New and clean tube of petroleum jelly (Vaseline) OR prescription antibiotic ointment if prescribed Either a bandaid  large enough to cover the entire wound OR non-stick dressing material (Telfa) and Tape (Paper or Hypafix)  FOR ADULT SURGERY PATIENTS: If you need something for pain relief, you may take 1 extra strength Tylenol (acetaminophen) and 2 ibuprofen (200 mg) together every 4 hours as needed. (Do not take these medications if you are allergic to them or if you know you cannot take them for any other reason). Typically you may only need pain medication for 1-3 days.   Comments on the Post-Operative Period Slight swelling and redness often appear around the wound. This is normal and will disappear within several days following the surgery. The healing wound will drain a brownish-red-yellow discharge during healing. This is a normal phase of wound healing. As the wound begins to heal, the drainage may increase in amount. Again, this drainage is normal. Notify us if the drainage becomes persistently bloody, excessively swollen, or intensely painful or develops a foul odor or red streaks.  The healing wound will also typically be itchy. This is normal. If you have severe or persistent pain, Notify us if the discomfort is severe or persistent. Avoid alcoholic beverages when taking pain medicine.  In Case of Wound Hemorrhage A wound hemorrhage is when the bandage suddenly becomes soaked with bright red blood and flows profusely. If this happens, sit down or lie down with your head elevated. If the wound has a dressing on it, do not remove the dressing. Apply pressure to  the existing gauze. If the wound is not covered, use a gauze pad to apply pressure and continue applying the pressure for 20 minutes without peeking. DO NOT COVER THE WOUND WITH A LARGE TOWEL OR WASH CLOTH. Release your hand from the wound site but do not remove the dressing. If the bleeding has stopped, gently clean around the wound. Leave the dressing in place for 24 hours if possible. This wait time allows the blood vessels to close off so that you  do not spark a new round of bleeding by disrupting the newly clotted blood vessels with an immediate dressing change. If the bleeding does not subside, continue to hold pressure for 40 minutes. If bleeding continues, page your physician, contact an After Hours clinic or go to the Emergency Room.  Due to recent changes in healthcare laws, you may see results of your pathology and/or laboratory studies on MyChart before the doctors have had a chance to review them. We understand that in some cases there may be results that are confusing or concerning to you. Please understand that not all results are received at the same time and often the doctors may need to interpret multiple results in order to provide you with the best plan of care or course of treatment. Therefore, we ask that you please give [place] 2 business days to thoroughly review all your results before contacting the office for clarification. Should we see a critical lab result, you will be contacted sooner.   If You Need Anything After Your Visit  If you have any questions or concerns for your doctor, please call our main line at [phone] and press option 4 to reach your doctor's medical assistant. If no one answers, please leave a voicemail as directed and we will return your call as soon as possible. Messages left after 4 pm will be answered the following business day.   You may also send [place] a message via MyChart. We typically respond to MyChart messages within 1-2 business days.  For prescription refills, please ask your pharmacy to contact our office. Our fax number is [phone].  If you have an urgent issue when the clinic is closed that cannot wait until the next business day, you can page your doctor at the number below.    Please note that while we do our best to be available for urgent issues outside of office hours, we are not available 24/7.   If you have an urgent issue and are unable to reach [place], you may choose to seek  medical care at your doctor's office, retail clinic, urgent care center, or emergency room.  If you have a medical emergency, please immediately call 911 or go to the emergency department.  Pager Numbers  - Dr. Gwen Pounds: [phone]  - Dr. Roseanne Reno: [phone]  - Dr. Katrinka Blazing: [phone]   In the event of inclement weather, please call our main line at [phone] for an update on the status of any delays or closures.  Dermatology Medication Tips: Please keep the boxes that topical medications come in in order to help keep track of the instructions about where and how to use these. Pharmacies typically print the medication instructions only on the boxes and not directly on the medication tubes.   If your medication is too expensive, please contact our office at [phone] option 4 or send [place] a message through MyChart.   We are unable to tell what your co-pay for medications will be in advance as this is different  depending on your insurance coverage. However, we may be able to find a substitute medication at lower cost or fill out paperwork to get insurance to cover a needed medication.   If a prior authorization is required to get your medication covered by your insurance company, please allow [place] 1-2 business days to complete this process.  Drug prices often vary depending on where the prescription is filled and some pharmacies may offer cheaper prices.  The website www.goodrx.com contains coupons for medications through different pharmacies. The prices here do not account for what the cost may be with help from insurance (it may be cheaper with your insurance), but the website can give you the price if you did not use any insurance.  - You can print the associated coupon and take it with your prescription to the pharmacy.  - You may also stop by our office during regular business hours and pick up a GoodRx coupon card.  - If you need your prescription sent electronically to a  different pharmacy, notify our office through [facility] or by phone at [phone] option 4.     Si Usted Necesita Algo Despus de Su Visita  Tambin puede enviarnos un mensaje a travs de Clinical cytogeneticist. Por lo general respondemos a los mensajes de MyChart en el transcurso de 1 a 2 das hbiles.  Para renovar recetas, por favor pida a su farmacia que se ponga en contacto con nuestra oficina. Annie Sable de fax es [place] [phone].  Si tiene un asunto urgente cuando la clnica est cerrada y que no puede esperar hasta el siguiente da hbil, puede llamar/localizar a su doctor(a) al nmero que aparece a continuacin.   Por favor, tenga en cuenta que aunque hacemos todo lo posible para estar disponibles para asuntos urgentes fuera del horario de [place], no estamos disponibles las 24 horas del da, los 7 [address]  [address] de la [place].   Si tiene un problema urgente y no puede comunicarse con nosotros, puede optar por buscar atencin mdica  en el consultorio de su doctor(a), en una clnica privada, en un centro de atencin urgente o en una sala de emergencias.  Si tiene Engineer, drilling, por favor llame inmediatamente al 911 o vaya a la sala de emergencias.  Nmeros de bper  - Dr. Gwen Pounds: [phone]  - Dra. Roseanne Reno: [phone]  - Dr. Katrinka Blazing: [phone]   En caso de inclemencias del tiempo, por favor llame a Lacy Duverney principal al [phone] para una actualizacin sobre el [place] de cualquier retraso o cierre.  Consejos para la medicacin en dermatologa: Por favor, guarde las cajas en las que vienen los medicamentos de uso tpico para ayudarle a seguir las instrucciones sobre dnde y cmo usarlos. Las farmacias generalmente imprimen las instrucciones del medicamento slo en las cajas y no directamente en los tubos del [place].   Si su medicamento es muy caro, por favor, pngase en contacto con Rolm Gala llamando al [phone] y presione la opcin 4 o envenos un  mensaje a travs de Clinical cytogeneticist.   No podemos decirle cul ser su copago por los medicamentos por adelantado ya que esto es diferente dependiendo de la cobertura de su seguro. Sin embargo, es posible que podamos encontrar un medicamento sustituto a Audiological scientist un formulario para que el seguro cubra el medicamento que se considera necesario.   Si se requiere una autorizacin previa para que su compaa de seguros [place] su medicamento, por favor permtanos de 1 a 2 das hbiles para completar  este proceso.  Los precios de los medicamentos varan con frecuencia dependiendo del Environmental consultant de dnde se surte la receta y alguna farmacias pueden ofrecer precios ms baratos.  El sitio web www.goodrx.com tiene cupones para medicamentos de Health and safety inspector. Los precios aqu no tienen en cuenta lo que podra costar con la ayuda del seguro (puede ser ms barato con su seguro), pero el sitio web puede darle el precio si no utiliz Tourist information centre manager.  - Puede imprimir el cupn correspondiente y llevarlo con su receta a la farmacia.  - Tambin puede pasar por nuestra oficina durante el horario de atencin regular y Education officer, museum una tarjeta de cupones de GoodRx.  - Si necesita que su receta se enve electrnicamente a una farmacia diferente, informe a nuestra oficina a travs de MyChart de [place] o por telfono llamando al [phone] y presione la opcin 4.

## 2023-05-24 NOTE — Telephone Encounter (Signed)
Patient doing well after today's surgery./sh 

## 2023-05-26 LAB — SURGICAL PATHOLOGY

## 2023-05-30 ENCOUNTER — Encounter: Payer: Self-pay | Admitting: Dermatology

## 2023-05-30 ENCOUNTER — Ambulatory Visit: Payer: Medicare HMO | Admitting: Dermatology

## 2023-05-30 DIAGNOSIS — D171 Benign lipomatous neoplasm of skin and subcutaneous tissue of trunk: Secondary | ICD-10-CM

## 2023-05-30 NOTE — Progress Notes (Signed)
   Follow-Up Visit   Subjective  Scott Le is a 72 y.o. male who presents for the following: 1 wk post op Lipoma bx proven L post waistline, pt presents for suture removal  The following portions of the chart were reviewed this encounter and updated as appropriate: medications, allergies, medical history  Review of Systems:  No other skin or systemic complaints except as noted in HPI or Assessment and Plan.  Objective  Well appearing patient in no apparent distress; mood and affect are within normal limits.   A focused examination was performed of the following areas: back  Relevant exam findings are noted in the Assessment and Plan.    Assessment & Plan   LIPOMA BX PROVEN L post waistline Exam: healing excision site  Treatment Plan: Encounter for Removal of Sutures - Incision site at the L post waistline is clean, dry and intact - Wound cleansed, sutures removed, wound cleansed and steri strips applied.  - Discussed pathology results showing Lipoma  - Patient advised to keep steri-strips dry until they fall off. - Scars remodel for a full year. - Once steri-strips fall off, patient can apply over-the-counter silicone scar cream each night to help with scar remodeling if desired. - Patient advised to call with any concerns or if they notice any new or changing lesions.      Return for as scheduled for TBSE.  I, Ardis Rowan, RMA, am acting as scribe for Willeen Niece, MD .   Documentation: I have reviewed the above documentation for accuracy and completeness, and I agree with the above.  Willeen Niece, MD

## 2023-05-30 NOTE — Patient Instructions (Signed)

## 2024-02-21 ENCOUNTER — Encounter: Payer: Medicare HMO | Admitting: Family Medicine

## 2024-05-10 ENCOUNTER — Ambulatory Visit: Payer: Medicare HMO | Admitting: Dermatology

## 2024-07-02 ENCOUNTER — Ambulatory Visit: Admitting: Dermatology

## 2024-07-11 ENCOUNTER — Ambulatory Visit: Admitting: Dermatology

## 2024-07-11 ENCOUNTER — Encounter: Payer: Self-pay | Admitting: Dermatology

## 2024-07-11 DIAGNOSIS — L570 Actinic keratosis: Secondary | ICD-10-CM | POA: Diagnosis not present

## 2024-07-11 DIAGNOSIS — L578 Other skin changes due to chronic exposure to nonionizing radiation: Secondary | ICD-10-CM | POA: Diagnosis not present

## 2024-07-11 DIAGNOSIS — Z1283 Encounter for screening for malignant neoplasm of skin: Secondary | ICD-10-CM | POA: Diagnosis not present

## 2024-07-11 DIAGNOSIS — D229 Melanocytic nevi, unspecified: Secondary | ICD-10-CM

## 2024-07-11 DIAGNOSIS — L821 Other seborrheic keratosis: Secondary | ICD-10-CM | POA: Diagnosis not present

## 2024-07-11 DIAGNOSIS — L814 Other melanin hyperpigmentation: Secondary | ICD-10-CM | POA: Diagnosis not present

## 2024-07-11 DIAGNOSIS — L729 Follicular cyst of the skin and subcutaneous tissue, unspecified: Secondary | ICD-10-CM

## 2024-07-11 DIAGNOSIS — L820 Inflamed seborrheic keratosis: Secondary | ICD-10-CM | POA: Diagnosis not present

## 2024-07-11 DIAGNOSIS — L720 Epidermal cyst: Secondary | ICD-10-CM

## 2024-07-11 DIAGNOSIS — D1801 Hemangioma of skin and subcutaneous tissue: Secondary | ICD-10-CM

## 2024-07-11 DIAGNOSIS — B079 Viral wart, unspecified: Secondary | ICD-10-CM

## 2024-07-11 DIAGNOSIS — W908XXA Exposure to other nonionizing radiation, initial encounter: Secondary | ICD-10-CM | POA: Diagnosis not present

## 2024-07-11 DIAGNOSIS — Z7189 Other specified counseling: Secondary | ICD-10-CM

## 2024-07-11 DIAGNOSIS — M795 Residual foreign body in soft tissue: Secondary | ICD-10-CM | POA: Diagnosis not present

## 2024-07-11 NOTE — Patient Instructions (Signed)

## 2024-07-11 NOTE — Progress Notes (Signed)
 "  Follow-Up Visit   Subjective  Scott Le is a 74 y.o. male who presents for the following: Skin Cancer Screening and Full Body Skin Exam  The patient presents for Total-Body Skin Exam (TBSE) for skin cancer screening and mole check. The patient has spots, moles and lesions to be evaluated, some may be new or changing and the patient may have concern these could be cancer.  The following portions of the chart were reviewed this encounter and updated as appropriate: medications, allergies, medical history  Review of Systems:  No other skin or systemic complaints except as noted in HPI or Assessment and Plan.  Objective  Well appearing patient in no apparent distress; mood and affect are within normal limits.  A full examination was performed including scalp, head, eyes, ears, nose, lips, neck, chest, axillae, abdomen, back, buttocks, bilateral upper extremities, bilateral lower extremities, hands, feet, fingers, toes, fingernails, and toenails. All findings within normal limits unless otherwise noted below.   Relevant physical exam findings are noted in the Assessment and Plan.  L foot middle lateral sole x 1 Verrucous papules -- Discussed viral etiology and contagion.  Scalp x 1, R nasal bridge x 1 (2) Erythematous thin papules/macules with gritty scale.  L infraorbital x 2 (2) Stuck on waxy paps with erythema  Assessment & Plan   SKIN CANCER SCREENING PERFORMED TODAY.  ACTINIC DAMAGE - Chronic condition, secondary to cumulative UV/sun exposure - diffuse scaly erythematous macules with underlying dyspigmentation - Recommend daily broad spectrum sunscreen SPF 30+ to sun-exposed areas, reapply every 2 hours as needed.  - Staying in the shade or wearing long sleeves, sun glasses (UVA+UVB protection) and wide brim hats (4-inch brim around the entire circumference of the hat) are also recommended for sun protection.  - Call for new or changing lesions.  LENTIGINES, SEBORRHEIC  KERATOSES, HEMANGIOMAS - Benign normal skin lesions - Benign-appearing - Call for any changes  MELANOCYTIC NEVI - Tan-brown and/or pink-flesh-colored symmetric macules and papules - Benign appearing on exam today - Observation - Call clinic for new or changing moles - Recommend daily use of broad spectrum spf 30+ sunscreen to sun-exposed areas.  VIRAL WARTS, UNSPECIFIED TYPE L foot middle lateral sole x 1 Viral Wart (HPV) Counseling  Discussed viral / HPV (Human Papilloma Virus) etiology and risk of spread /infectivity to other areas of body as well as to other people.  Multiple treatments and methods may be required to clear warts and it is possible treatment may not be successful.  Treatment risks include discoloration; scarring and there is still potential for wart recurrence. - Destruction of lesion - L foot middle lateral sole x 1 Complexity: simple   Destruction method: cryotherapy   Informed consent: discussed and consent obtained   Timeout:  patient name, date of birth, surgical site, and procedure verified Lesion destroyed using liquid nitrogen: Yes   Region frozen until ice ball extended beyond lesion: Yes   Outcome: patient tolerated procedure well with no complications   Post-procedure details: wound care instructions given    AK (ACTINIC KERATOSIS) (2) Scalp x 1, R nasal bridge x 1 (2) Actinic keratoses are precancerous spots that appear secondary to cumulative UV radiation exposure/sun exposure over time. They are chronic with expected duration over 1 year. A portion of actinic keratoses will progress to squamous cell carcinoma of the skin. It is not possible to reliably predict which spots will progress to skin cancer and so treatment is recommended to prevent development of  skin cancer.  Recommend daily broad spectrum sunscreen SPF 30+ to sun-exposed areas, reapply every 2 hours as needed.  Recommend staying in the shade or wearing long sleeves, sun glasses (UVA+UVB  protection) and wide brim hats (4-inch brim around the entire circumference of the hat). Call for new or changing lesions.  - Destruction of lesion - Scalp x 1, R nasal bridge x 1 (2) Complexity: simple   Destruction method: cryotherapy   Informed consent: discussed and consent obtained   Timeout:  patient name, date of birth, surgical site, and procedure verified Lesion destroyed using liquid nitrogen: Yes   Region frozen until ice ball extended beyond lesion: Yes   Outcome: patient tolerated procedure well with no complications   Post-procedure details: wound care instructions given    INFLAMED SEBORRHEIC KERATOSIS (2) L infraorbital x 2 (2) Symptomatic, irritating, patient would like treated. - Destruction of lesion - L infraorbital x 2 (2) Complexity: simple   Destruction method: cryotherapy   Informed consent: discussed and consent obtained   Timeout:  patient name, date of birth, surgical site, and procedure verified Lesion destroyed using liquid nitrogen: Yes   Region frozen until ice ball extended beyond lesion: Yes   Outcome: patient tolerated procedure well with no complications   Post-procedure details: wound care instructions given     EPIDERMAL INCLUSION CYST Exam: Subcutaneous nodule at L post side 0.5 cm  Benign-appearing. Exam most consistent with an epidermal inclusion cyst. Discussed that a cyst is a benign growth that can grow over time and sometimes get irritated or inflamed. Recommend observation if it is not bothersome. Discussed option of surgical excision to remove it if it is growing, symptomatic, or other changes noted. Please call for new or changing lesions so they can be evaluated.  Foreign body (piece of metal by history) in the L sup lat pectoral above and lat to areola - 0.5 cm firm papule. Pt reports hx of metal at location that was confirmed by x-ray. Discussed and recommend seeing general surgeon for removal if bothersome.  It may be muscle but not  sure. I do not think it is necessary to remove unless his primary care physician thinks it is necessary.  Return in about 1 year (around 07/11/2025) for TBSE - hx AK, ISK.  I, Rosina Mayans, CMA, am acting as scribe for Alm Rhyme, MD .  Documentation: I have reviewed the above documentation for accuracy and completeness, and I agree with the above.  Alm Rhyme, MD    "

## 2025-07-11 ENCOUNTER — Ambulatory Visit: Admitting: Dermatology
